# Patient Record
Sex: Female | Born: 1940 | Hispanic: No | Smoking: Never smoker
Health system: Southern US, Community
[De-identification: ages and names within clinical notes are randomized; demographics above are authoritative.]

## PROBLEM LIST (undated history)

## (undated) DIAGNOSIS — I10 Essential (primary) hypertension: Secondary | ICD-10-CM

## (undated) DIAGNOSIS — H5440 Blindness, one eye, unspecified eye: Secondary | ICD-10-CM

## (undated) DIAGNOSIS — S72001A Fracture of unspecified part of neck of right femur, initial encounter for closed fracture: Secondary | ICD-10-CM

---

## 1998-07-16 ENCOUNTER — Other Ambulatory Visit: Admission: RE | Admit: 1998-07-16 | Discharge: 1998-07-16 | Payer: Self-pay | Admitting: Family Medicine

## 1999-10-27 ENCOUNTER — Other Ambulatory Visit: Admission: RE | Admit: 1999-10-27 | Discharge: 1999-10-27 | Payer: Self-pay | Admitting: Family Medicine

## 2001-01-29 ENCOUNTER — Other Ambulatory Visit: Admission: RE | Admit: 2001-01-29 | Discharge: 2001-01-29 | Payer: Self-pay | Admitting: Family Medicine

## 2018-01-04 ENCOUNTER — Emergency Department (HOSPITAL_COMMUNITY): Payer: Medicare Other

## 2018-01-04 ENCOUNTER — Encounter (HOSPITAL_COMMUNITY): Payer: Self-pay | Admitting: Emergency Medicine

## 2018-01-04 ENCOUNTER — Inpatient Hospital Stay (HOSPITAL_COMMUNITY): Payer: Medicare Other

## 2018-01-04 ENCOUNTER — Inpatient Hospital Stay (HOSPITAL_COMMUNITY)
Admission: EM | Admit: 2018-01-04 | Discharge: 2018-01-06 | DRG: 470 | Disposition: A | Discharge status: Home / Self Care | Payer: Medicare Other | Attending: Internal Medicine | Admitting: Internal Medicine

## 2018-01-04 ENCOUNTER — Other Ambulatory Visit: Payer: Self-pay

## 2018-01-04 ENCOUNTER — Inpatient Hospital Stay (HOSPITAL_COMMUNITY): Payer: Medicare Other | Admitting: Certified Registered"

## 2018-01-04 ENCOUNTER — Encounter (HOSPITAL_COMMUNITY)
Admission: EM | Disposition: A | Discharge status: Home / Self Care | Payer: Self-pay | Source: Home / Self Care | Attending: Internal Medicine

## 2018-01-04 DIAGNOSIS — H5461 Unqualified visual loss, right eye, normal vision left eye: Secondary | ICD-10-CM | POA: Diagnosis present

## 2018-01-04 DIAGNOSIS — Z96641 Presence of right artificial hip joint: Secondary | ICD-10-CM

## 2018-01-04 DIAGNOSIS — Z8249 Family history of ischemic heart disease and other diseases of the circulatory system: Secondary | ICD-10-CM

## 2018-01-04 DIAGNOSIS — W07XXXA Fall from chair, initial encounter: Secondary | ICD-10-CM | POA: Diagnosis present

## 2018-01-04 DIAGNOSIS — S72009A Fracture of unspecified part of neck of unspecified femur, initial encounter for closed fracture: Secondary | ICD-10-CM | POA: Diagnosis present

## 2018-01-04 DIAGNOSIS — Z880 Allergy status to penicillin: Secondary | ICD-10-CM | POA: Diagnosis not present

## 2018-01-04 DIAGNOSIS — Z8781 Personal history of (healed) traumatic fracture: Secondary | ICD-10-CM

## 2018-01-04 DIAGNOSIS — D62 Acute posthemorrhagic anemia: Secondary | ICD-10-CM | POA: Diagnosis not present

## 2018-01-04 DIAGNOSIS — I10 Essential (primary) hypertension: Secondary | ICD-10-CM | POA: Diagnosis present

## 2018-01-04 DIAGNOSIS — M1990 Unspecified osteoarthritis, unspecified site: Secondary | ICD-10-CM | POA: Diagnosis present

## 2018-01-04 DIAGNOSIS — D649 Anemia, unspecified: Secondary | ICD-10-CM | POA: Diagnosis not present

## 2018-01-04 DIAGNOSIS — Z888 Allergy status to other drugs, medicaments and biological substances status: Secondary | ICD-10-CM

## 2018-01-04 DIAGNOSIS — Z01811 Encounter for preprocedural respiratory examination: Secondary | ICD-10-CM | POA: Diagnosis present

## 2018-01-04 DIAGNOSIS — Z886 Allergy status to analgesic agent status: Secondary | ICD-10-CM | POA: Diagnosis not present

## 2018-01-04 DIAGNOSIS — S72041A Displaced fracture of base of neck of right femur, initial encounter for closed fracture: Secondary | ICD-10-CM | POA: Diagnosis present

## 2018-01-04 DIAGNOSIS — M25551 Pain in right hip: Secondary | ICD-10-CM | POA: Diagnosis present

## 2018-01-04 DIAGNOSIS — Z419 Encounter for procedure for purposes other than remedying health state, unspecified: Secondary | ICD-10-CM

## 2018-01-04 HISTORY — DX: Blindness, one eye, unspecified eye: H54.40

## 2018-01-04 HISTORY — PX: TOTAL HIP ARTHROPLASTY: SHX124

## 2018-01-04 HISTORY — DX: Essential (primary) hypertension: I10

## 2018-01-04 HISTORY — DX: Fracture of unspecified part of neck of right femur, initial encounter for closed fracture: S72.001A

## 2018-01-04 LAB — CBC WITH DIFFERENTIAL/PLATELET
Basophils Absolute: 0.1 K/uL (ref 0.0–0.1)
Basophils Relative: 1 %
Eosinophils Absolute: 0.4 K/uL (ref 0.0–0.7)
Eosinophils Relative: 5 %
HCT: 42.3 % (ref 36.0–46.0)
Hemoglobin: 14.1 g/dL (ref 12.0–15.0)
Lymphocytes Relative: 17 %
Lymphs Abs: 1.5 K/uL (ref 0.7–4.0)
MCH: 28.7 pg (ref 26.0–34.0)
MCHC: 33.3 g/dL (ref 30.0–36.0)
MCV: 86.2 fL (ref 78.0–100.0)
Monocytes Absolute: 0.8 K/uL (ref 0.1–1.0)
Monocytes Relative: 9 %
Neutro Abs: 6.2 K/uL (ref 1.7–7.7)
Neutrophils Relative %: 68 %
Platelets: 264 K/uL (ref 150–400)
RBC: 4.91 MIL/uL (ref 3.87–5.11)
RDW: 13 % (ref 11.5–15.5)
WBC: 9 K/uL (ref 4.0–10.5)

## 2018-01-04 LAB — PROTIME-INR
INR: 0.98
PROTHROMBIN TIME: 12.9 s (ref 11.4–15.2)

## 2018-01-04 LAB — BASIC METABOLIC PANEL
Anion gap: 11 (ref 5–15)
BUN: 16 mg/dL (ref 6–20)
CALCIUM: 9.8 mg/dL (ref 8.9–10.3)
CO2: 22 mmol/L (ref 22–32)
CREATININE: 0.66 mg/dL (ref 0.44–1.00)
Chloride: 106 mmol/L (ref 101–111)
GFR calc Af Amer: >60 mL/min (ref >60)
GFR calc non Af Amer: >60 mL/min (ref >60)
GLUCOSE: 109 mg/dL — AB (ref 65–99)
Potassium: 4.1 mmol/L (ref 3.5–5.1)
SODIUM: 139 mmol/L (ref 135–145)

## 2018-01-04 LAB — TYPE AND SCREEN
ABO/RH(D): O POS
Antibody Screen: NEGATIVE

## 2018-01-04 LAB — ABO/RH: ABO/RH(D): O POS

## 2018-01-04 SURGERY — ARTHROPLASTY, HIP, TOTAL, ANTERIOR APPROACH
Anesthesia: Spinal | Site: Hip | Laterality: Right

## 2018-01-04 MED ORDER — LIDOCAINE HCL (CARDIAC) 20 MG/ML IV SOLN
INTRAVENOUS | Status: AC
  Filled 2018-01-04: qty 5

## 2018-01-04 MED ORDER — ONDANSETRON HCL 4 MG/2ML IJ SOLN
INTRAMUSCULAR | Status: DC | PRN
  Administered 2018-01-04: 4 mg via INTRAVENOUS

## 2018-01-04 MED ORDER — PANTOPRAZOLE SODIUM 40 MG PO TBEC
40.0000 mg | DELAYED_RELEASE_TABLET | Freq: Every day | ORAL | Status: DC
  Administered 2018-01-04 – 2018-01-06 (×3): 40 mg via ORAL
  Filled 2018-01-04 (×3): qty 1

## 2018-01-04 MED ORDER — MORPHINE SULFATE (PF) 2 MG/ML IV SOLN
0.5000 mg | INTRAVENOUS | Status: DC | PRN
  Administered 2018-01-04: 1 mg via INTRAVENOUS
  Filled 2018-01-04: qty 1

## 2018-01-04 MED ORDER — FENTANYL CITRATE (PF) 250 MCG/5ML IJ SOLN
INTRAMUSCULAR | Status: AC
  Filled 2018-01-04: qty 5

## 2018-01-04 MED ORDER — HYDROCODONE-ACETAMINOPHEN 5-325 MG PO TABS
1.0000 | ORAL_TABLET | ORAL | Status: DC | PRN
  Administered 2018-01-04: 1 via ORAL
  Administered 2018-01-05 (×5): 2 via ORAL
  Administered 2018-01-06 (×2): 1 via ORAL
  Administered 2018-01-06: 2 via ORAL
  Filled 2018-01-04 (×3): qty 2
  Filled 2018-01-04 (×2): qty 1
  Filled 2018-01-04: qty 2
  Filled 2018-01-04: qty 1
  Filled 2018-01-04 (×2): qty 2

## 2018-01-04 MED ORDER — DOCUSATE SODIUM 100 MG PO CAPS
100.0000 mg | ORAL_CAPSULE | Freq: Two times a day (BID) | ORAL | Status: DC
  Administered 2018-01-04 – 2018-01-06 (×4): 100 mg via ORAL
  Filled 2018-01-04 (×4): qty 1

## 2018-01-04 MED ORDER — CLINDAMYCIN PHOSPHATE 900 MG/50ML IV SOLN
900.0000 mg | INTRAVENOUS | Status: AC
  Administered 2018-01-04: 900 mg via INTRAVENOUS
  Filled 2018-01-04: qty 50

## 2018-01-04 MED ORDER — ONDANSETRON HCL 4 MG PO TABS
4.0000 mg | ORAL_TABLET | Freq: Four times a day (QID) | ORAL | Status: DC | PRN
  Administered 2018-01-05: 4 mg via ORAL
  Filled 2018-01-04: qty 1

## 2018-01-04 MED ORDER — METHOCARBAMOL 1000 MG/10ML IJ SOLN: 500.0000 mg | Freq: Four times a day (QID) | INTRAVENOUS | Status: DC | PRN

## 2018-01-04 MED ORDER — ALUM & MAG HYDROXIDE-SIMETH 200-200-20 MG/5ML PO SUSP: 30.0000 mL | ORAL | Status: DC | PRN

## 2018-01-04 MED ORDER — MEPERIDINE HCL 50 MG/ML IJ SOLN: 6.2500 mg | INTRAMUSCULAR | Status: DC | PRN

## 2018-01-04 MED ORDER — METHOCARBAMOL 500 MG PO TABS: 500.0000 mg | ORAL_TABLET | Freq: Four times a day (QID) | ORAL | Status: DC | PRN

## 2018-01-04 MED ORDER — PHENYLEPHRINE 40 MCG/ML (10ML) SYRINGE FOR IV PUSH (FOR BLOOD PRESSURE SUPPORT)
PREFILLED_SYRINGE | INTRAVENOUS | Status: DC | PRN
  Administered 2018-01-04: 80 ug via INTRAVENOUS

## 2018-01-04 MED ORDER — BUPIVACAINE IN DEXTROSE 0.75-8.25 % IT SOLN
INTRATHECAL | Status: DC | PRN
  Administered 2018-01-04: 12 mg via INTRATHECAL

## 2018-01-04 MED ORDER — BISACODYL 5 MG PO TBEC: 5.0000 mg | DELAYED_RELEASE_TABLET | Freq: Every day | ORAL | Status: DC | PRN

## 2018-01-04 MED ORDER — PROPOFOL 10 MG/ML IV BOLUS
INTRAVENOUS | Status: DC | PRN
  Administered 2018-01-04: 20 mg via INTRAVENOUS
  Administered 2018-01-04: 30 mg via INTRAVENOUS
  Administered 2018-01-04: 20 mg via INTRAVENOUS
  Administered 2018-01-04 (×2): 30 mg via INTRAVENOUS

## 2018-01-04 MED ORDER — PROPOFOL 500 MG/50ML IV EMUL
INTRAVENOUS | Status: DC | PRN
  Administered 2018-01-04: 50 ug/kg/min via INTRAVENOUS

## 2018-01-04 MED ORDER — CHLORHEXIDINE GLUCONATE 4 % EX LIQD: 60.0000 mL | Freq: Once | CUTANEOUS | Status: DC

## 2018-01-04 MED ORDER — FENTANYL CITRATE (PF) 100 MCG/2ML IJ SOLN
INTRAMUSCULAR | Status: AC
  Filled 2018-01-04: qty 2

## 2018-01-04 MED ORDER — METOCLOPRAMIDE HCL 5 MG PO TABS: 5.0000 mg | ORAL_TABLET | Freq: Three times a day (TID) | ORAL | Status: DC | PRN

## 2018-01-04 MED ORDER — SODIUM CHLORIDE 0.9 % IV SOLN: INTRAVENOUS | Status: DC

## 2018-01-04 MED ORDER — FENTANYL CITRATE (PF) 100 MCG/2ML IJ SOLN
INTRAMUSCULAR | Status: DC | PRN
  Administered 2018-01-04 (×3): 50 ug via INTRAVENOUS

## 2018-01-04 MED ORDER — TRANEXAMIC ACID 1000 MG/10ML IV SOLN
1000.0000 mg | INTRAVENOUS | Status: AC
  Administered 2018-01-04: 1000 mg via INTRAVENOUS
  Filled 2018-01-04: qty 10

## 2018-01-04 MED ORDER — PHENYLEPHRINE HCL 10 MG/ML IJ SOLN
INTRAMUSCULAR | Status: DC | PRN
  Administered 2018-01-04: 20 ug/min via INTRAVENOUS

## 2018-01-04 MED ORDER — METOCLOPRAMIDE HCL 5 MG/ML IJ SOLN: 5.0000 mg | Freq: Three times a day (TID) | INTRAMUSCULAR | Status: DC | PRN

## 2018-01-04 MED ORDER — CLINDAMYCIN PHOSPHATE 900 MG/50ML IV SOLN
INTRAVENOUS | Status: AC
Start: 2018-01-04 — End: 2018-01-04
  Filled 2018-01-04: qty 50

## 2018-01-04 MED ORDER — MIDAZOLAM HCL 2 MG/2ML IJ SOLN: 0.5000 mg | Freq: Once | INTRAMUSCULAR | Status: DC | PRN

## 2018-01-04 MED ORDER — ACETAMINOPHEN 325 MG PO TABS
325.0000 mg | ORAL_TABLET | Freq: Four times a day (QID) | ORAL | Status: DC | PRN
  Administered 2018-01-05 – 2018-01-06 (×2): 650 mg via ORAL
  Filled 2018-01-04 (×2): qty 2

## 2018-01-04 MED ORDER — PROMETHAZINE HCL 25 MG/ML IJ SOLN: 6.2500 mg | INTRAMUSCULAR | Status: DC | PRN

## 2018-01-04 MED ORDER — CLINDAMYCIN PHOSPHATE 600 MG/50ML IV SOLN
600.0000 mg | Freq: Four times a day (QID) | INTRAVENOUS | Status: AC
  Administered 2018-01-04 – 2018-01-05 (×2): 600 mg via INTRAVENOUS
  Filled 2018-01-04 (×2): qty 50

## 2018-01-04 MED ORDER — HYDROCODONE-ACETAMINOPHEN 5-325 MG PO TABS: 1.0000 | ORAL_TABLET | Freq: Four times a day (QID) | ORAL | Status: DC | PRN

## 2018-01-04 MED ORDER — POVIDONE-IODINE 10 % EX SWAB: 2.0000 "application " | Freq: Once | CUTANEOUS | Status: DC

## 2018-01-04 MED ORDER — LACTATED RINGERS IV SOLN
INTRAVENOUS | Status: DC | PRN
  Administered 2018-01-04 (×2): via INTRAVENOUS

## 2018-01-04 MED ORDER — MORPHINE SULFATE (PF) 4 MG/ML IV SOLN
0.5000 mg | INTRAVENOUS | Status: DC | PRN
  Filled 2018-01-04: qty 1

## 2018-01-04 MED ORDER — LIDOCAINE 2% (20 MG/ML) 5 ML SYRINGE
INTRAMUSCULAR | Status: DC | PRN
Start: 2018-01-04 — End: 2018-01-04
  Administered 2018-01-04: 40 mg via INTRAVENOUS

## 2018-01-04 MED ORDER — FENTANYL CITRATE (PF) 100 MCG/2ML IJ SOLN
25.0000 ug | INTRAMUSCULAR | Status: DC | PRN
  Administered 2018-01-04 (×2): 25 ug via INTRAVENOUS

## 2018-01-04 MED ORDER — HYDROCODONE-ACETAMINOPHEN 7.5-325 MG PO TABS: 1.0000 | ORAL_TABLET | ORAL | Status: DC | PRN

## 2018-01-04 MED ORDER — PHENOL 1.4 % MT LIQD: 1.0000 | OROMUCOSAL | Status: DC | PRN

## 2018-01-04 MED ORDER — 0.9 % SODIUM CHLORIDE (POUR BTL) OPTIME
TOPICAL | Status: DC | PRN
  Administered 2018-01-04: 1000 mL

## 2018-01-04 MED ORDER — LACTATED RINGERS IV SOLN: INTRAVENOUS | Status: DC

## 2018-01-04 MED ORDER — SODIUM CHLORIDE 0.9 % IR SOLN
Status: DC | PRN
  Administered 2018-01-04: 3000 mL

## 2018-01-04 MED ORDER — PROPOFOL 10 MG/ML IV BOLUS
INTRAVENOUS | Status: AC
  Filled 2018-01-04: qty 20

## 2018-01-04 MED ORDER — METHOCARBAMOL 1000 MG/10ML IJ SOLN
500.0000 mg | Freq: Four times a day (QID) | INTRAVENOUS | Status: DC | PRN
  Filled 2018-01-04: qty 5

## 2018-01-04 MED ORDER — MENTHOL 3 MG MT LOZG: 1.0000 | LOZENGE | OROMUCOSAL | Status: DC | PRN

## 2018-01-04 MED ORDER — POLYETHYLENE GLYCOL 3350 17 G PO PACK: 17.0000 g | PACK | Freq: Every day | ORAL | Status: DC | PRN

## 2018-01-04 MED ORDER — ZOLPIDEM TARTRATE 5 MG PO TABS: 5.0000 mg | ORAL_TABLET | Freq: Every evening | ORAL | Status: DC | PRN

## 2018-01-04 MED ORDER — ONDANSETRON HCL 4 MG/2ML IJ SOLN: 4.0000 mg | Freq: Four times a day (QID) | INTRAMUSCULAR | Status: DC | PRN

## 2018-01-04 MED ORDER — METHOCARBAMOL 500 MG PO TABS
500.0000 mg | ORAL_TABLET | Freq: Four times a day (QID) | ORAL | Status: DC | PRN
  Administered 2018-01-04 – 2018-01-05 (×3): 500 mg via ORAL
  Filled 2018-01-04 (×3): qty 1

## 2018-01-04 MED ORDER — SODIUM CHLORIDE 0.9 % IV SOLN
INTRAVENOUS | Status: DC
  Administered 2018-01-04: 23:00:00 via INTRAVENOUS

## 2018-01-04 SURGICAL SUPPLY — 56 items
APL SKNCLS STERI-STRIP NONHPOA (GAUZE/BANDAGES/DRESSINGS) ×1
BENZOIN TINCTURE PRP APPL 2/3 (GAUZE/BANDAGES/DRESSINGS) ×3 IMPLANT
BLADE CLIPPER SURG (BLADE) IMPLANT
BLADE SAW SGTL 18X1.27X75 (BLADE) ×2 IMPLANT
BLADE SAW SGTL 18X1.27X75MM (BLADE) ×1
CAPT HIP TOTAL 2 ×2 IMPLANT
CELLS DAT CNTRL 66122 CELL SVR (MISCELLANEOUS) IMPLANT
CLOSURE WOUND 1/2 X4 (GAUZE/BANDAGES/DRESSINGS) ×2
COVER SURGICAL LIGHT HANDLE (MISCELLANEOUS) ×3 IMPLANT
DRAPE C-ARM 42X72 X-RAY (DRAPES) ×3 IMPLANT
DRAPE STERI IOBAN 125X83 (DRAPES) ×3 IMPLANT
DRAPE U-SHAPE 47X51 STRL (DRAPES) ×9 IMPLANT
DRESSING ALLEVYN LIFE SACRUM (GAUZE/BANDAGES/DRESSINGS) ×2 IMPLANT
DRSG AQUACEL AG ADV 3.5X10 (GAUZE/BANDAGES/DRESSINGS) ×3 IMPLANT
DURAPREP 26ML APPLICATOR (WOUND CARE) ×3 IMPLANT
ELECT BLADE 4.0 EZ CLEAN MEGAD (MISCELLANEOUS) ×3
ELECT BLADE 6.5 EXT (BLADE) IMPLANT
ELECT REM PT RETURN 9FT ADLT (ELECTROSURGICAL) ×3
ELECTRODE BLDE 4.0 EZ CLN MEGD (MISCELLANEOUS) ×1 IMPLANT
ELECTRODE REM PT RTRN 9FT ADLT (ELECTROSURGICAL) ×1 IMPLANT
FACESHIELD WRAPAROUND (MASK) ×6 IMPLANT
FACESHIELD WRAPAROUND OR TEAM (MASK) ×2 IMPLANT
GAUZE XEROFORM 1X8 LF (GAUZE/BANDAGES/DRESSINGS) ×2 IMPLANT
GLOVE BIOGEL PI IND STRL 8 (GLOVE) ×2 IMPLANT
GLOVE BIOGEL PI INDICATOR 8 (GLOVE) ×4
GLOVE ECLIPSE 8.0 STRL XLNG CF (GLOVE) ×3 IMPLANT
GLOVE ORTHO TXT STRL SZ7.5 (GLOVE) ×6 IMPLANT
GOWN STRL REUS W/ TWL LRG LVL3 (GOWN DISPOSABLE) ×2 IMPLANT
GOWN STRL REUS W/ TWL XL LVL3 (GOWN DISPOSABLE) ×2 IMPLANT
GOWN STRL REUS W/TWL LRG LVL3 (GOWN DISPOSABLE) ×6
GOWN STRL REUS W/TWL XL LVL3 (GOWN DISPOSABLE) ×6
HANDPIECE INTERPULSE COAX TIP (DISPOSABLE) ×3
KIT BASIN OR (CUSTOM PROCEDURE TRAY) ×3 IMPLANT
KIT ROOM TURNOVER OR (KITS) ×3 IMPLANT
MANIFOLD NEPTUNE II (INSTRUMENTS) ×3 IMPLANT
NS IRRIG 1000ML POUR BTL (IV SOLUTION) ×3 IMPLANT
PACK TOTAL JOINT (CUSTOM PROCEDURE TRAY) ×3 IMPLANT
PAD ARMBOARD 7.5X6 YLW CONV (MISCELLANEOUS) ×3 IMPLANT
RETRACTOR WND ALEXIS 18 MED (MISCELLANEOUS) ×1 IMPLANT
RTRCTR WOUND ALEXIS 18CM MED (MISCELLANEOUS)
SET HNDPC FAN SPRY TIP SCT (DISPOSABLE) ×1 IMPLANT
STAPLER VISISTAT 35W (STAPLE) ×2 IMPLANT
STRIP CLOSURE SKIN 1/2X4 (GAUZE/BANDAGES/DRESSINGS) ×4 IMPLANT
SUT ETHIBOND NAB CT1 #1 30IN (SUTURE) ×3 IMPLANT
SUT MNCRL AB 4-0 PS2 18 (SUTURE) IMPLANT
SUT VIC AB 0 CT1 27 (SUTURE) ×3
SUT VIC AB 0 CT1 27XBRD ANBCTR (SUTURE) ×1 IMPLANT
SUT VIC AB 1 CT1 27 (SUTURE) ×3
SUT VIC AB 1 CT1 27XBRD ANBCTR (SUTURE) ×1 IMPLANT
SUT VIC AB 2-0 CT1 27 (SUTURE) ×3
SUT VIC AB 2-0 CT1 TAPERPNT 27 (SUTURE) ×1 IMPLANT
TOWEL OR 17X24 6PK STRL BLUE (TOWEL DISPOSABLE) ×3 IMPLANT
TOWEL OR 17X26 10 PK STRL BLUE (TOWEL DISPOSABLE) ×3 IMPLANT
TRAY CATH 16FR W/PLASTIC CATH (SET/KITS/TRAYS/PACK) ×2 IMPLANT
TRAY FOLEY W/METER SILVER 16FR (SET/KITS/TRAYS/PACK) IMPLANT
WATER STERILE IRR 1000ML POUR (IV SOLUTION) ×6 IMPLANT

## 2018-01-04 NOTE — Brief Op Note (Signed)
01/04/2018  6:06 PM  PATIENT:  Kiara Reynolds  77 y.o. female  PRE-OPERATIVE DIAGNOSIS:  Femoral neck Fracture  POST-OPERATIVE DIAGNOSIS:  Femoral neck Fracture  PROCEDURE:  Procedure(s): TOTAL HIP ARTHROPLASTY ANTERIOR APPROACH (Right)  SURGEON:  Surgeon(s) and Role:    Kathryne Hitch* Maricsa Sammons Y, MD - Primary  PHYSICIAN ASSISTANT: Rexene EdisonGil Clark, PA-C  ANESTHESIA:   spinal  COUNTS:  YES  PLAN OF CARE: Admit to inpatient   PATIENT DISPOSITION:  PACU - hemodynamically stable.   Delay start of Pharmacological VTE agent (>24hrs) due to surgical blood loss or risk of bleeding: no

## 2018-01-04 NOTE — ED Triage Notes (Signed)
Pt to ER sent from PCP due to confirmed right hip fracture after a fall nearly 2 weeks ago. Has been ambulatory. Pt in NAD.

## 2018-01-04 NOTE — Anesthesia Preprocedure Evaluation (Addendum)
Anesthesia Evaluation  Patient identified by MRN, date of birth, ID band Patient awake    Reviewed: Allergy & Precautions, NPO status , Patient's Chart, lab work & pertinent test results  History of Anesthesia Complications Negative for: history of anesthetic complications  Airway Mallampati: II  TM Distance: >3 FB Neck ROM: Full    Dental  (+) Dental Advisory Given   Pulmonary neg pulmonary ROS,    breath sounds clear to auscultation       Cardiovascular hypertension (on no meds), (-) angina Rhythm:Regular Rate:Normal     Neuro/Psych negative neurological ROS     GI/Hepatic negative GI ROS, Neg liver ROS,   Endo/Other  negative endocrine ROS  Renal/GU negative Renal ROS     Musculoskeletal  (+) Arthritis , Osteoarthritis,    Abdominal   Peds  Hematology negative hematology ROS (+)   Anesthesia Other Findings   Reproductive/Obstetrics                            Anesthesia Physical Anesthesia Plan  ASA: II  Anesthesia Plan: Spinal   Post-op Pain Management:    Induction:   PONV Risk Score and Plan: 2 and Ondansetron and Dexamethasone  Airway Management Planned: Natural Airway and Nasal Cannula  Additional Equipment:   Intra-op Plan:   Post-operative Plan:   Informed Consent: I have reviewed the patients History and Physical, chart, labs and discussed the procedure including the risks, benefits and alternatives for the proposed anesthesia with the patient or authorized representative who has indicated his/her understanding and acceptance.   Dental advisory given  Plan Discussed with: CRNA and Surgeon  Anesthesia Plan Comments: (Plan routine monitors, SAB)        Anesthesia Quick Evaluation

## 2018-01-04 NOTE — Anesthesia Procedure Notes (Signed)
Spinal  Patient location during procedure: OR End time: 01/04/2018 4:38 PM Staffing Anesthesiologist: Jairo BenJackson, Francies Inch, MD Performed: anesthesiologist  Preanesthetic Checklist Completed: patient identified, site marked, surgical consent, pre-op evaluation, timeout performed, IV checked, risks and benefits discussed and monitors and equipment checked Spinal Block Patient position: sitting Prep: site prepped and draped and DuraPrep Patient monitoring: heart rate, cardiac monitor, continuous pulse ox and blood pressure Approach: midline Location: L3-4 Injection technique: single-shot Needle Needle type: Quincke  Needle gauge: 25 G Needle length: 9 cm Additional Notes Pt identified in Operating room.  Monitors applied. Working IV access confirmed. Sterile prep, drape lumbar spine.  1% lido local L 3,4.  #25ga Quincke into clear CSF L 3,4.  12mg  0.75% Bupivacaine with dextrose injected with asp CSF beginning and end of injection.  Patient asymptomatic, VSS, no heme aspirated, tolerated well.  Sandford Craze Theresea Trautmann, MD

## 2018-01-04 NOTE — ED Notes (Signed)
Pt has images with her.

## 2018-01-04 NOTE — ED Provider Notes (Signed)
Patient placed in Quick Look pathway, seen and evaluated   Chief Complaint: hip fracture   HPI:   Patient fell off a stool 2 weeks ago, right hip pain since.  Went to primary care doctor who ordered outpatient images of her hip and was told to come here for a positive fracture.  Patient has x-rays with her, I reviewed the x-rays, does appear to have intertrochanteric fracture.  She has been ambulatory on it.  Denies any other injuries.  No numbness or weakness distally.  ROS: Positive for hip pain.  Negative for numbness or weakness.  Negative for back pain.  Physical Exam:   Gen: No distress  Neuro: Awake and Alert  Skin: Warm    Focused Exam: Tenderness to palpation of her right hip.  Pain with range of motion of the hip.  Dorsal pedal pulses intact.   Initiation of care has begun. The patient has been counseled on the process, plan, and necessity for staying for the completion/evaluation, and the remainder of the medical screening examination   Patient medically screened by me by request of RN.  She is hypertensive, otherwise normal vital signs.  Positive hip fracture on x-ray.  I was able to pull up images, however I will reimage her so we have x-rays in our system.  We will get hip fracture protocol lab orders.  This was all discussed with patient she agrees. Patient prefers Dr. Magnus IvanBlackman as an orthopedist, informed her he is on call today.    Jaynie CrumbleKirichenko, Catia Todorov, PA-C 01/04/18 1244    Charlynne PanderYao, David Hsienta, MD 01/07/18 1220

## 2018-01-04 NOTE — Transfer of Care (Signed)
Immediate Anesthesia Transfer of Care Note  Patient: Alberteen Samlizabeth Stotler  Procedure(s) Performed: TOTAL HIP ARTHROPLASTY ANTERIOR APPROACH (Right Hip)  Patient Location: PACU  Anesthesia Type:Spinal  Level of Consciousness: awake and patient cooperative  Airway & Oxygen Therapy: Patient Spontanous Breathing and Patient connected to face mask oxygen  Post-op Assessment: Report given to RN and Post -op Vital signs reviewed and stable  Post vital signs: Reviewed and stable  Last Vitals:  Vitals:   01/04/18 1228  BP: (!) 171/73  Pulse: 81  Resp: 18  Temp: 36.7 C  SpO2: 100%    Last Pain:  Vitals:   01/04/18 1228  TempSrc: Oral         Complications: No apparent anesthesia complications

## 2018-01-04 NOTE — OR Nursing (Signed)
1805: in&out cath=250cc cyu, per protocol, no trauma.

## 2018-01-04 NOTE — OR Nursing (Signed)
1810:  allevyn sacral pad placed to initiate protocol; skin clear and supple at this time.

## 2018-01-04 NOTE — ED Provider Notes (Signed)
[facility] EMERGENCY DEPARTMENT Provider Note   CSN: [id number] Arrival date & time: 01/04/18  1221     History   Chief Complaint Chief Complaint  Patient presents with  . Hip Pain    HPI Kiara Reynolds is a 77 y.o. female.  HPI Kiara Reynolds is a 78 y.o. female presents to ED with complaint of right hip pain. Pt states she fell off a stool 2 weeks ago. Reports soreness, but able to ambulate.  She went to her primary care doctor who ordered x-rays and she was told to come to the ED because x-rays were positive for hip fracture.  No history of the same.  No medical problems.  Does not take any medications.  She states walking and moving hip makes it worse, nothing makes it better.  Past Medical History:  Diagnosis Date  . Blind right eye     There are no active problems to display for this patient.   History reviewed. No pertinent surgical history.  OB History    No data available       Home Medications    Prior to Admission medications   Not on File    Family History History reviewed. No pertinent family history.  Social History Social History   Tobacco Use  . Smoking status: Never Smoker  . Smokeless tobacco: Never Used  Substance Use Topics  . Alcohol use: No    Frequency: Never  . Drug use: No     Allergies   Penicillins and Aspirin   Review of Systems Review of Systems  Constitutional: Negative for chills and fever.  Respiratory: Negative for cough, chest tightness and shortness of breath.   Cardiovascular: Negative for chest pain, palpitations and leg swelling.  Gastrointestinal: Negative for abdominal pain, diarrhea, nausea and vomiting.  Musculoskeletal: Positive for arthralgias. Negative for neck pain and neck stiffness.  Skin: Negative for rash.  Neurological: Negative for dizziness, weakness and headaches.  All other systems reviewed and are negative.    Physical Exam Updated Vital Signs BP (!) 171/73 (BP  Location: Right Arm)   Pulse 81   Temp 98 F (36.7 C) (Oral)   Resp 18   SpO2 100%   Physical Exam  Constitutional: She appears well-developed and well-nourished. No distress.  HENT:  Head: Normocephalic.  Eyes: Conjunctivae are normal.  Neck: Neck supple.  Cardiovascular: Normal rate, regular rhythm and normal heart sounds.  Pulmonary/Chest: Effort normal and breath sounds normal. No respiratory distress. She has no wheezes. She has no rales.  Abdominal: Soft. Bowel sounds are normal. She exhibits no distension. There is no tenderness. There is no rebound.  Musculoskeletal: She exhibits no edema.  TTP over right hip. Limited ROM due to pain. DP pulses intact.   Neurological: She is alert.  Skin: Skin is warm and dry.  Psychiatric: She has a normal mood and affect. Her behavior is normal.  Nursing note and vitals reviewed.    ED Treatments / Results  Labs (all labs ordered are listed, but only abnormal results are displayed) Labs Reviewed  BASIC METABOLIC PANEL - Abnormal; Notable for the following components:      Result Value   Glucose, Bld 109 (*)    All other components within normal limits  CBC WITH DIFFERENTIAL/PLATELET  PROTIME-INR  TYPE AND SCREEN    EKG  EKG Interpretation None       Radiology Dg Hip Unilat With Pelvis 2-3 Views Right  Result Date: 01/04/2018 CLINICAL DATA:  Acute RIGHT hip pain following fall 10 days ago. Initial encounter. EXAM: DG HIP (WITH OR WITHOUT PELVIS) 2-3V RIGHT COMPARISON:  None FINDINGS: A RIGHT femoral neck fracture is noted valgus angulation. There is no evidence of dislocation. The visualized portions of the LEFT hip are unremarkable. Degenerative changes in the LOWER lumbar spine are identified. IMPRESSION: RIGHT femoral neck fracture. Electronically Signed   By: Harmon PierJeffrey  Hu M.D.   On: 01/04/2018 13:20    Procedures Procedures (including critical care time)  Medications Ordered in ED Medications - No data to  display   Initial Impression / Assessment and Plan / ED Course  I have reviewed the triage vital signs and the nursing notes.  Pertinent labs & imaging results that were available during my care of the patient were reviewed by me and considered in my medical decision making (see chart for details).     Pt with right hip fracture, two weeks ago. Hip fracture labs ordered. Pt does not want any medications at this time. She is neurovascularly intact  1:50 PM Spoke with Orthopedics, will come by and see pt. ]  2:01 PM Spoke with medicine, will admit  Vitals:   01/04/18 1228  BP: (!) 171/73  Pulse: 81  Resp: 18  Temp: 98 F (36.7 C)  TempSrc: Oral  SpO2: 100%     Final Clinical Impressions(s) / ED Diagnoses   Final diagnoses:  S/P right hip fracture    ED Discharge Orders    None       Jaynie CrumbleKirichenko, Benjiman Sedgwick, PA-C 01/04/18 1402    Charlynne PanderYao, David Hsienta, MD 01/07/18 [id number]

## 2018-01-04 NOTE — H&P (Signed)
History and Physical    Kiara Reynolds ZOX:[id number] DOB: 24-Nov-1940 DOA: 01/04/2018  PCP: No primary care provider on file. 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 Initial evaluation reveals right femur fracture. [facility] are asked to admit  Information is obtained from the patient and her husband who is at the bedside. She states 2 days ago she was sitting on a stool and she "slid off and landed on my right hip". She reports she was unable to get up on her 27 year old grandson had to assist her to the couch. She then needed assistance to the car to go home. For the next 2 days he ambulated with a cane and/or walker she reports the pain improved. She did indicate it was difficult to sleep to find a comfortable position. Today after much encouragement from her family and lack of further improvement in her pain she went to her primary care provider who ordered x-rays and she was told to come to the ED because x-rays were positive for hip fracture. She denies any recent illness fever chills headache dizziness syncope or near-syncope. She denies any nausea vomiting diarrhea constipation melena bright red blood per rectum. She denies dysuria hematuria frequency or urgency. She denies chest pain palpitation shortness of breath lower extremity edema. She describes the pain in her right hip as an ache characterizes it as constant worse with movement and weightbearing. She reports that nothing makes it better.   ED Course: In the emergency department she's afebrile hemodynamically stable with a blood pressure high end of normal. She is not hypoxic  Review of Systems: As per HPI otherwise all other systems reviewed and are negative.   Ambulatory Status: Ambulates  independently is independent with ADLs.  Past Medical History:  Diagnosis Date  . Blind right eye   . Hip fx, right, closed, initial encounter (HCC)   . Hypertension    not on antihypertensive med    History reviewed. No pertinent surgical history.  Social History   Socioeconomic History  . Marital status: Unknown    Spouse name: Not on file  . Number of children: Not on file  . Years of education: Not on file  . Highest education level: Not on file  Social Needs  . Financial resource strain: Not on file  . Food insecurity - worry: Not on file  . Food insecurity - inability: Not on file  . Transportation needs - medical: Not on file  . Transportation needs - non-medical: Not on file  Occupational History  . Not on file  Tobacco Use  . Smoking status: Never Smoker  . Smokeless tobacco: Never Used  Substance and Sexual Activity  . Alcohol use: No    Frequency: Never  . Drug use: No  . Sexual activity: Not on file  Other Topics Concern  . Not on file  Social History Narrative  . Not on file    Allergies  Allergen Reactions  . Penicillins   . Aspirin     Family History  Problem Relation Age of Onset  . Hypertension Mother     Prior to Admission medications   Not on File    Physical Exam: Vitals:   01/04/18 1228  BP: (!) 171/73  Pulse: 81  Resp: 18  Temp:  98 F (36.7 C)  TempSrc: Oral  SpO2: 100%     General:  Appears calm and comfortable sitting in a wheelchair with follow her close on in no acute distress Eyes:  PERRL, EOMI, normal lids, iris ENT:  grossly normal hearing, lips & tongue, mucous membranes of her mouth are pink and dry Neck:  no LAD, masses or thyromegaly Cardiovascular:  RRR, no m/r/g. No LE edema.  Respiratory:  CTA bilaterally, no w/r/r. Normal respiratory effort. Abdomen:  soft, nondistended nontender to palpation positive bowel sounds throughout no guarding or rebounding Skin:  no rash or induration seen on limited  exam Musculoskeletal:  grossly normal tone BUE/BLE, good ROM, no bony abnormality. Mild tenderness to palpation over her right hip I pedal pulse intact. Decreased range of motion of her right leg due to hip pain Psychiatric:  grossly normal mood and affect, speech fluent and appropriate, AOx3 Neurologic:  CN 2-12 grossly intact, moves all extremities in coordinated fashion, sensation intact speech clear facial symmetry  Labs on Admission: I have personally reviewed following labs and imaging studies  CBC: Recent Labs  Lab 01/04/18 1255  WBC 9.0  NEUTROABS 6.2  HGB 14.1  HCT 42.3  MCV 86.2  PLT 264   Basic Metabolic Panel: Recent Labs  Lab 01/04/18 1255  NA 139  K 4.1  CL 106  CO2 22  GLUCOSE 109*  BUN 16  CREATININE 0.66  CALCIUM 9.8   GFR: CrCl cannot be calculated (Unknown ideal weight.). Liver Function Tests: No results for input(s): AST, ALT, ALKPHOS, BILITOT, PROT, ALBUMIN in the last 168 hours. No results for input(s): LIPASE, AMYLASE in the last 168 hours. No results for input(s): AMMONIA in the last 168 hours. Coagulation Profile: Recent Labs  Lab 01/04/18 1255  INR 0.98   Cardiac Enzymes: No results for input(s): CKTOTAL, CKMB, CKMBINDEX, TROPONINI in the last 168 hours. BNP (last 3 results) No results for input(s): PROBNP in the last 8760 hours. HbA1C: No results for input(s): HGBA1C in the last 72 hours. CBG: No results for input(s): GLUCAP in the last 168 hours. Lipid Profile: No results for input(s): CHOL, HDL, LDLCALC, TRIG, CHOLHDL, LDLDIRECT in the last 72 hours. Thyroid Function Tests: No results for input(s): TSH, T4TOTAL, FREET4, T3FREE, THYROIDAB in the last 72 hours. Anemia Panel: No results for input(s): VITAMINB12, FOLATE, FERRITIN, TIBC, IRON, RETICCTPCT in the last 72 hours. Urine analysis: No results found for: COLORURINE, APPEARANCEUR, LABSPEC, PHURINE, GLUCOSEU, HGBUR, BILIRUBINUR, KETONESUR, PROTEINUR, UROBILINOGEN, NITRITE,  LEUKOCYTESUR  Creatinine Clearance: CrCl cannot be calculated (Unknown ideal weight.).  Sepsis Labs: @LABRCNTIP (procalcitonin:4,lacticidven:4) )No results found for this or any previous visit (from the past 240 hour(s)).   Radiological Exams on Admission: Dg Hip Unilat With Pelvis 2-3 Views Right  Result Date: 01/04/2018 CLINICAL DATA:  Acute RIGHT hip pain following fall 10 days ago. Initial encounter. EXAM: DG HIP (WITH OR WITHOUT PELVIS) 2-3V RIGHT COMPARISON:  None FINDINGS: A RIGHT femoral neck fracture is noted valgus angulation. There is no evidence of dislocation. The visualized portions of the LEFT hip are unremarkable. Degenerative changes in the LOWER lumbar spine are identified. IMPRESSION: RIGHT femoral neck fracture. Electronically Signed   By: Harmon PierJeffrey  Hu M.D.   On: 01/04/2018 13:20    EKG: pending on admission  Assessment/Plan Active Problems:   Hip fx (HCC)   Hypertension   Right hip pain   #1. Right hip fracture. Related to a mechanical fall. X-ray reveals right femoral neck fracture. Mechanical fall happened 2  days ago. She's been ambulating on it ever since. She's been evaluated by orthopedic surgery who planned for surgery this afternoon -Admit to MedSurg -Obtain a chest x-ray -Urinalysis -Pain control -gentle iv fluids -robaxin -PT -social work -Nothing by mouth  #2. Hypertension. Patient states she was treated for about a year and then medication was discontinued his hypertension resolved. Blood pressure high-end of normal in the emergency department. Likely related to discomfort/anxiety -When necessary hydralazine     DVT prophylaxis: scd Code Status: full  Family Communication: husband at bedside  Disposition Plan: home  Consults called: dr Magnus Ivan ortho  Admission status: inpatient    Gwenyth Bender MD [facility]  If 7PM-7AM, please contact night-coverage [email or web address] Password [email or web address]  01/04/2018, 2:30 PM

## 2018-01-04 NOTE — Consult Note (Signed)
Reason for Consult:Right fem neck fx Referring Physician: D Ashyia Schraeder is an 77 y.o. female.  HPI: Kiara Reynolds lost her balance on a stool about 10d ago. She landed on her right side and had immediate pain. She was able to get up and ambulate and has been using a cane since then. She states the pain has slowly been improving but family insisted she come in and get checked out. Her PCP did an x-ray and noted a femoral neck fx and sent her to the ED and orthopedic surgery was consulted. She is an independent Hydrographic surveyor.  Past Medical History:  Diagnosis Date  . Blind right eye     History reviewed. No pertinent surgical history.  History reviewed. No pertinent family history.  Social History:  reports that  has never smoked. she has never used smokeless tobacco. She reports that she does not drink alcohol or use drugs.  Allergies:  Allergies  Allergen Reactions  . Penicillins   . Aspirin     Medications: I have reviewed the patient's current medications.  Results for orders placed or performed during the hospital encounter of 01/04/18 (from the past 48 hour(s))  Basic metabolic panel     Status: Abnormal   Collection Time: 01/04/18 12:55 PM  Result Value Ref Range   Sodium 139 135 - 145 mmol/L   Potassium 4.1 3.5 - 5.1 mmol/L   Chloride 106 101 - 111 mmol/L   CO2 22 22 - 32 mmol/L   Glucose, Bld 109 (H) 65 - 99 mg/dL   BUN 16 6 - 20 mg/dL   Creatinine, Ser 0.66 0.44 - 1.00 mg/dL   Calcium 9.8 8.9 - 10.3 mg/dL   GFR calc non Af Amer >60 >60 mL/min   GFR calc Af Amer >60 >60 mL/min    Comment: (NOTE) The eGFR has been calculated using the CKD EPI equation. This calculation has not been validated in all clinical situations. eGFR's persistently <60 mL/min signify possible Chronic Kidney Disease.    Anion gap 11 5 - 15    Comment: Performed at [facility] [address] [place], [place] [address]  CBC WITH DIFFERENTIAL     Status: None   Collection  Time: 01/04/18 12:55 PM  Result Value Ref Range   WBC 9.0 4.0 - 10.5 K/uL   RBC 4.91 3.87 - 5.11 MIL/uL   Hemoglobin 14.1 12.0 - 15.0 g/dL   HCT 42.3 36.0 - 46.0 %   MCV 86.2 78.0 - 100.0 fL   MCH 28.7 26.0 - 34.0 pg   MCHC 33.3 30.0 - 36.0 g/dL   RDW 13.0 11.5 - 15.5 %   Platelets 264 150 - 400 K/uL   Neutrophils Relative % 68 %   Neutro Abs 6.2 1.7 - 7.7 K/uL   Lymphocytes Relative 17 %   Lymphs Abs 1.5 0.7 - 4.0 K/uL   Monocytes Relative 9 %   Monocytes Absolute 0.8 0.1 - 1.0 K/uL   Eosinophils Relative 5 %   Eosinophils Absolute 0.4 0.0 - 0.7 K/uL   Basophils Relative 1 %   Basophils Absolute 0.1 0.0 - 0.1 K/uL    Comment: Performed at [facility], [place] [address], [place], [place] [address]  Protime-INR     Status: None   Collection Time: 01/04/18 12:55 PM  Result Value Ref Range   Prothrombin Time 12.9 11.4 - 15.2 seconds   INR 0.98     Comment: Performed at [facility], [place] [address] [place], [place] [address]    Dg Hip Unilat With Pelvis 2-3 Views Right  Result Date: 01/04/2018 CLINICAL DATA:  Acute RIGHT hip pain following fall 10 days ago. Initial encounter. EXAM: DG HIP (WITH OR WITHOUT PELVIS) 2-3V RIGHT COMPARISON:  None FINDINGS: A RIGHT femoral neck fracture is noted valgus angulation. There is no evidence of dislocation. The visualized portions of the LEFT hip are unremarkable. Degenerative changes in the LOWER lumbar spine are identified. IMPRESSION: RIGHT femoral neck fracture. Electronically Signed   By: Margarette Canada M.D.   On: 01/04/2018 13:20    Review of Systems  Constitutional: Negative for weight loss.  HENT: Negative for ear discharge, ear pain, hearing loss and tinnitus.   Eyes: Negative for blurred vision, double vision, photophobia and pain.  Respiratory: Negative for cough, sputum production and shortness of breath.   Cardiovascular: Negative for chest pain.  Gastrointestinal: Negative for abdominal pain, nausea and vomiting.   Genitourinary: Negative for dysuria, flank pain, frequency and urgency.  Musculoskeletal: Positive for joint pain (Right hip). Negative for back pain, falls, myalgias and neck pain.  Neurological: Negative for dizziness, tingling, sensory change, focal weakness, loss of consciousness and headaches.  Endo/Heme/Allergies: Does not bruise/bleed easily.  Psychiatric/Behavioral: Negative for depression, memory loss and substance abuse. The patient is not nervous/anxious.    Blood pressure (!) 171/73, pulse 81, temperature 98 F (36.7 C), temperature source Oral, resp. rate 18, SpO2 100 %. Physical Exam  Constitutional: She appears well-developed and well-nourished. No distress.  HENT:  Head: Normocephalic and atraumatic.  Eyes: Conjunctivae are normal. Right eye exhibits no discharge. Left eye exhibits no discharge. No scleral icterus.  Neck: Normal range of motion.  Cardiovascular: Normal rate and regular rhythm.  Respiratory: Effort normal. No respiratory distress.  Musculoskeletal:  RLE No traumatic wounds, ecchymosis, or rash  Mild hip TTP  No knee or ankle effusion  Knee stable to varus/ valgus and anterior/posterior stress, mild medial TTP  Sens DPN, SPN, TN intact  Motor EHL, ext, flex, evers 5/5  DP 2+, PT 2+, No significant edema  Neurological: She is alert.  Skin: Skin is warm and dry. She is not diaphoretic.  Psychiatric: She has a normal mood and affect. Her behavior is normal.    Assessment/Plan: Fall Right femoral neck fx -- For THA this afternoon by Dr. Ninfa Linden. NPO until then.    Lisette Abu, PA-C Orthopedic Surgery [phone] 01/04/2018, 1:43 PM

## 2018-01-04 NOTE — Anesthesia Postprocedure Evaluation (Signed)
Anesthesia Post Note  Patient: Kiara Reynolds  Procedure(s) Performed: TOTAL HIP ARTHROPLASTY ANTERIOR APPROACH (Right Hip)     Patient location during evaluation: PACU Anesthesia Type: Spinal Level of consciousness: awake and alert, patient cooperative and oriented Pain management: pain level controlled Vital Signs Assessment: post-procedure vital signs reviewed and stable Respiratory status: spontaneous breathing, nonlabored ventilation, respiratory function stable and patient connected to nasal cannula oxygen Cardiovascular status: blood pressure returned to baseline and stable Postop Assessment: spinal receding, patient able to bend at knees and no apparent nausea or vomiting Anesthetic complications: no    Last Vitals:  Vitals:   01/04/18 1900 01/04/18 1901  BP:  (!) 122/58  Pulse: 69 70  Resp: 14 15  Temp:    SpO2: 99% 99%    Last Pain:  Vitals:   01/04/18 1228  TempSrc: Oral                 Carmela Piechowski,E. Santiago Graf

## 2018-01-05 DIAGNOSIS — D649 Anemia, unspecified: Secondary | ICD-10-CM

## 2018-01-05 LAB — CBC
HEMATOCRIT: 32.3 % — AB (ref 36.0–46.0)
HEMOGLOBIN: 10.6 g/dL — AB (ref 12.0–15.0)
MCH: 28.6 pg (ref 26.0–34.0)
MCHC: 32.8 g/dL (ref 30.0–36.0)
MCV: 87.1 fL (ref 78.0–100.0)
Platelets: 197 10*3/uL (ref 150–400)
RBC: 3.71 MIL/uL — AB (ref 3.87–5.11)
RDW: 13.1 % (ref 11.5–15.5)
WBC: 8.7 10*3/uL (ref 4.0–10.5)

## 2018-01-05 LAB — BASIC METABOLIC PANEL
Anion gap: 9 (ref 5–15)
BUN: 16 mg/dL (ref 6–20)
CHLORIDE: 104 mmol/L (ref 101–111)
CO2: 23 mmol/L (ref 22–32)
Calcium: 8.4 mg/dL — ABNORMAL LOW (ref 8.9–10.3)
Creatinine, Ser: 0.73 mg/dL (ref 0.44–1.00)
GFR calc non Af Amer: >60 mL/min (ref >60)
Glucose, Bld: 127 mg/dL — ABNORMAL HIGH (ref 65–99)
Potassium: 4.1 mmol/L (ref 3.5–5.1)
SODIUM: 136 mmol/L (ref 135–145)

## 2018-01-05 LAB — URINALYSIS, ROUTINE W REFLEX MICROSCOPIC
BILIRUBIN URINE: NEGATIVE
Bacteria, UA: NONE SEEN
GLUCOSE, UA: NEGATIVE mg/dL
KETONES UR: NEGATIVE mg/dL
LEUKOCYTES UA: NEGATIVE
NITRITE: NEGATIVE
PH: 5 (ref 5.0–8.0)
PROTEIN: NEGATIVE mg/dL
Specific Gravity, Urine: 1.006 (ref 1.005–1.030)
Squamous Epithelial / LPF: NONE SEEN

## 2018-01-05 LAB — HEMOGLOBIN AND HEMATOCRIT, BLOOD
HEMATOCRIT: 32.6 % — AB (ref 36.0–46.0)
Hemoglobin: 10.9 g/dL — ABNORMAL LOW (ref 12.0–15.0)

## 2018-01-05 MED ORDER — ENSURE ENLIVE PO LIQD
237.0000 mL | Freq: Two times a day (BID) | ORAL | Status: DC
  Administered 2018-01-05 – 2018-01-06 (×2): 237 mL via ORAL

## 2018-01-05 MED ORDER — ENOXAPARIN SODIUM 40 MG/0.4ML ~~LOC~~ SOLN: 40.0000 mg | SUBCUTANEOUS | Status: DC

## 2018-01-05 MED ORDER — ENOXAPARIN SODIUM 40 MG/0.4ML ~~LOC~~ SOLN
40.0000 mg | SUBCUTANEOUS | Status: DC
  Administered 2018-01-05: 40 mg via SUBCUTANEOUS
  Filled 2018-01-05: qty 0.4

## 2018-01-05 NOTE — Evaluation (Signed)
Occupational Therapy Evaluation Patient Details Name: Kiara Reynolds MRN: [id number] DOB: 29-Jan-1941 Today's Date: 01/05/2018    History of Present Illness Pt. is a 77 y.o. female who recently underwent a R THA. Pt. PMH includes HTN and visual deficits in R eye.    Clinical Impression   Pt. reports she was modified independent in ADLs PTA. Currently, pt. requires min guard-min assist during functional mobility and moderate assistance during LB ADLs. Pt. was educated on compensatory strategies for shower transfers and LB dressing. Pt. would benefit from training on how to complete shower transfer for safety upon return home. Recommendation is that pt. d/c home with spouse and with intermittent supervision for safety. Future OT sessions are needed to address LB ADLs and functional mobility. Pt. May or may not benefit from 3 in 1 to increase independence, will continue to assess.    Follow Up Recommendations  No OT follow up;Supervision - Intermittent    Equipment Recommendations  3 in 1 bedside commode(may not need; continue to assess)    Recommendations for Other Services       Precautions / Restrictions Precautions Precautions: Fall Restrictions Weight Bearing Restrictions: Yes RLE Weight Bearing: Weight bearing as tolerated      Mobility Bed Mobility               General bed mobility comments: Pt. was OOB in chair upon arrivall.  Transfers Overall transfer level: Needs assistance Equipment used: Rolling walker (2 wheeled) Transfers: Sit to/from Stand Sit to Stand: Min guard;Min assist         General transfer comment: Min assist for sit to stand from chair, min guard from toilet. Pt. was instructed on proper hand placement during transfer. Pt. took more time to complete transfer.    Balance Overall balance assessment: Needs assistance Sitting-balance support: Feet supported Sitting balance-Leahy Scale: Good     Standing balance support: Bilateral upper  extremity supported;During functional activity Standing balance-Leahy Scale: Poor Standing balance comment: RW for support                           ADL either performed or assessed with clinical judgement   ADL Overall ADL's : Needs assistance/impaired Eating/Feeding: Set up;Sitting   Grooming: Set up;Sitting;Wash/dry hands   Upper Body Bathing: Set up;Sitting   Lower Body Bathing: Moderate assistance;Cueing for safety;Cueing for compensatory techniques;Sit to/from stand   Upper Body Dressing : Set up;Supervision/safety;Sitting   Lower Body Dressing: Moderate assistance;Cueing for compensatory techniques;Cueing for safety;Sit to/from stand Lower Body Dressing Details (indicate cue type and reason): Pt. could use assitance getting LB garments started over feet. Educated on compensatory strategies Toilet Transfer: Min guard;Cueing for safety;RW;Ambulation   Toileting- ArchitectClothing Manipulation and Hygiene: Set up;Supervision/safety;Cueing for safety;Sitting/lateral lean Toileting - Clothing Manipulation Details (indicate cue type and reason): Pt. was able to complete peri care with min guard.   Tub/Shower Transfer Details (indicate cue type and reason): Pt. would benefit from education on compensatory techniques for tub transfer, but pt. intends to use walk-in shower at home. Pt. will be educated on compensatory techniques for approaching walk-in shower. Functional mobility during ADLs: Min guard;Cueing for safety;Rolling walker       Vision         Perception     Praxis      Pertinent Vitals/Pain Pain Assessment: Faces Faces Pain Scale: Hurts little more Pain Location: R hip Pain Descriptors / Indicators: Discomfort;Aching;Sore Pain Intervention(s): Limited activity within patient's tolerance;Monitored  during session;Patient requesting pain meds-RN notified;Ice applied     Hand Dominance     Extremity/Trunk Assessment Upper Extremity Assessment Upper Extremity  Assessment: Overall WFL for tasks assessed   Lower Extremity Assessment Lower Extremity Assessment: Defer to PT evaluation   Cervical / Trunk Assessment Cervical / Trunk Assessment: Normal   Communication Communication Communication: No difficulties   Cognition Arousal/Alertness: Awake/alert Behavior During Therapy: WFL for tasks assessed/performed Overall Cognitive Status: Within Functional Limits for tasks assessed                                     General Comments       Exercises     Shoulder Instructions      Home Living Family/patient expects to be discharged to:: Private residence Living Arrangements: Spouse/significant other Available Help at Discharge: Family Type of Home: House       Home Layout: One level     Bathroom Shower/Tub: Tub/shower unit;Walk-in shower(Plans to use walk in shower)   Bathroom Toilet: Standard     Home Equipment: Environmental consultant - 2 wheels;Cane - single point;Shower seat - built in          Prior Functioning/Environment Level of Independence: Independent with assistive device(s)        Comments: Using cane vs RW for mobility        OT Problem List: Decreased strength;Decreased activity tolerance;Impaired balance (sitting and/or standing);Decreased knowledge of use of DME or AE;Decreased knowledge of precautions;Pain      OT Treatment/Interventions: Self-care/ADL training;Energy conservation;DME and/or AE instruction;Therapeutic activities;Patient/family education;Balance training    OT Goals(Current goals can be found in the care plan section) Acute Rehab OT Goals Patient Stated Goal: return to independence OT Goal Formulation: With patient Time For Goal Achievement: 01/19/18 Potential to Achieve Goals: Good ADL Goals Pt Will Perform Lower Body Bathing: with supervision;sit to/from stand Pt Will Perform Lower Body Dressing: with supervision;sit to/from stand Pt Will Perform Tub/Shower Transfer: Shower  transfer;with supervision;ambulating;shower seat;rolling walker  OT Frequency: Min 2X/week   Barriers to D/C:            Co-evaluation              AM-PAC PT "6 Clicks" Daily Activity     Outcome Measure Help from another person eating meals?: None Help from another person taking care of personal grooming?: None Help from another person toileting, which includes using toliet, bedpan, or urinal?: A Little Help from another person bathing (including washing, rinsing, drying)?: A Lot Help from another person to put on and taking off regular upper body clothing?: None Help from another person to put on and taking off regular lower body clothing?: A Lot 6 Click Score: 19   End of Session Equipment Utilized During Treatment: Rolling walker  Activity Tolerance: Patient tolerated treatment well Patient left: in chair;with call bell/phone within reach;with family/visitor present  OT Visit Diagnosis: Unsteadiness on feet (R26.81);Other abnormalities of gait and mobility (R26.89);Pain;History of falling (Z91.81) Pain - Right/Left: Right Pain - part of body: Hip                Time: 1610-9604 OT Time Calculation (min): 25 min Charges:  OT General Charges $OT Visit: 1 Visit OT Evaluation $OT Eval Moderate Complexity: 1 Mod OT Treatments $Self Care/Home Management : 8-22 mins G-Codes:     Viola Placeres A. Brett Albino, M.S., OTR/L Pager: [phone]  Gaye Alken 01/05/2018, 2:45  PM

## 2018-01-05 NOTE — Progress Notes (Signed)
PROGRESS NOTE  Kiara Reynolds EAV:[id number] DOB: 01-29-1941 DOA: 01/04/2018 PCP: Patient, No Pcp Per  HPI/Recap of past 24 hours: 77 yr old female with a history of HTN (not on any meds), presents to the ED c/o R hip pain after a mechanical fall which happened about 10 days prior to presentation. Due to the persistent pain, pt saw her PCP who ordered an xray which showed R femoral neck fracture. Pt was advised to go to the hospital. Ortho was consulted and pt had Rt total hip arthroplasty on 01/04/18. Pt admitted for further management.  Today, pt reported post op R hip pain controlled with meds. Denies any SOB, chest pain, abdominal pain, N/V/D/C, fever/chills.  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 Acute blood loss s/p surgery Vs dilutional Repeat CBC, type and screen done Monitor closely  HTN Controlled Not on any meds at home    Code Status: Full  Family Communication: Husband at bedside  Disposition Plan: Home once stable   Consultants:  Orthopedics  Procedures:  R hip arthroplasty   Antimicrobials:  None  DVT prophylaxis:  Lovenox   Objective: Vitals:   01/04/18 2010 01/04/18 2024 01/05/18 0036 01/05/18 0608  BP:  (!) 137/57 (!) 109/47 (!) 110/51  Pulse:  80 81 91  Resp:      Temp: (!) 97.4 F (36.3 C) 97.7 F (36.5 C) 97.7 F (36.5 C) 97.8 F (36.6 C)  TempSrc:  Oral Oral Oral  SpO2:  97% 96% 97%  Weight:      Height:        Intake/Output Summary (Last 24 hours) at 01/05/2018 1147 Last data filed at 01/05/2018 1123 Gross per 24 hour  Intake 1250 ml  Output 650 ml  Net 600 ml   Filed Weights   01/04/18 1537  Weight:  65.3 kg (144 lb)    Exam:   General:  NAD  Cardiovascular: S1, S2 present  Respiratory: CTA  Abdomen: Soft, NT, ND, BS+  Musculoskeletal: Mild R hip pain, no pedal edema bilaterally  Skin: Normal  Psychiatry: Normal mood   Data Reviewed: CBC: Recent Labs  Lab 01/04/18 1255 01/05/18 0400  WBC 9.0 8.7  NEUTROABS 6.2  --   HGB 14.1 10.6*  HCT 42.3 32.3*  MCV 86.2 87.1  PLT 264 197   Basic Metabolic Panel: Recent Labs  Lab 01/04/18 1255 01/05/18 0400  NA 139 136  K 4.1 4.1  CL 106 104  CO2 22 23  GLUCOSE 109* 127*  BUN 16 16  CREATININE 0.66 0.73  CALCIUM 9.8 8.4*   GFR: Estimated Creatinine Clearance: 53.9 mL/min (by C-G formula based on SCr of 0.73 mg/dL). Liver Function Tests: No results for input(s): AST, ALT, ALKPHOS, BILITOT, PROT, ALBUMIN in the last 168 hours. No results for input(s): LIPASE, AMYLASE in the last 168 hours. No results for input(s): AMMONIA in the last 168 hours. Coagulation Profile: Recent Labs  Lab 01/04/18 1255  INR 0.98   Cardiac Enzymes: No results for input(s): CKTOTAL, CKMB, CKMBINDEX, TROPONINI in the last 168 hours. BNP (last 3 results) No results for input(s): PROBNP in the last 8760 hours. HbA1C: No results for input(s): HGBA1C in  the last 72 hours. CBG: No results for input(s): GLUCAP in the last 168 hours. Lipid Profile: No results for input(s): CHOL, HDL, LDLCALC, TRIG, CHOLHDL, LDLDIRECT in the last 72 hours. Thyroid Function Tests: No results for input(s): TSH, T4TOTAL, FREET4, T3FREE, THYROIDAB in the last 72 hours. Anemia Panel: No results for input(s): VITAMINB12, FOLATE, FERRITIN, TIBC, IRON, RETICCTPCT in the last 72 hours. Urine analysis: No results found for: COLORURINE, APPEARANCEUR, LABSPEC, PHURINE, GLUCOSEU, HGBUR, BILIRUBINUR, KETONESUR, PROTEINUR, UROBILINOGEN, NITRITE, LEUKOCYTESUR Sepsis Labs: @LABRCNTIP (procalcitonin:4,lacticidven:4)  )No results found for this or any previous visit (from  the past 240 hour(s)).    Studies: Dg Chest 1 View  Result Date: 01/04/2018 CLINICAL DATA:  77 y/o  F; right hip surgery today. EXAM: CHEST 1 VIEW COMPARISON:  None. FINDINGS: The heart size and mediastinal contours are within normal limits. Both lungs are clear. The visualized skeletal structures are unremarkable. IMPRESSION: No active disease. Electronically Signed   By: Mitzi Hansen M.D.   On: 01/04/2018 14:55   Pelvis Portable  Result Date: 01/04/2018 CLINICAL DATA:  Status post right hip replacement EXAM: PORTABLE PELVIS 1-2 VIEWS COMPARISON:  01/04/2018 FINDINGS: SI joints are patent. Pubic symphysis and rami are intact. Interval right hip replacement with normal alignment. Gas in the soft tissues of the right hip and thigh. Cutaneous staples. IMPRESSION: 1. Interval right hip replacement with normal alignment 2. Gas in the soft tissues of the lateral hip and thigh, consistent with recent post operative status Electronically Signed   By: Jasmine Pang M.D.   On: 01/04/2018 19:24   Dg C-arm 1-60 Min  Result Date: 01/04/2018 CLINICAL DATA:  77 y/o F; intraoperative fluoroscopy of right total hip arthroplasty with anterior approach. EXAM: DG C-ARM 61-120 MIN; OPERATIVE RIGHT HIP WITH PELVIS COMPARISON:  01/04/2018 pelvis and right hip radiographs. FINDINGS: Five intraoperative fluoroscopic images of anterior approach right total hip arthroplasty. Fluoro time is 39 seconds. The arthroplasty is well seated and there is postoperative air and edema in soft tissues. IMPRESSION: Right total hip arthroplasty intraoperative fluoroscopy. Fluoro time 39 seconds. Electronically Signed   By: Mitzi Hansen M.D.   On: 01/04/2018 18:03   Dg Hip Operative Unilat W Or W/o Pelvis Right  Result Date: 01/04/2018 CLINICAL DATA:  77 y/o F; intraoperative fluoroscopy of right total hip arthroplasty with anterior approach. EXAM: DG C-ARM 61-120 MIN; OPERATIVE RIGHT HIP WITH PELVIS COMPARISON:   01/04/2018 pelvis and right hip radiographs. FINDINGS: Five intraoperative fluoroscopic images of anterior approach right total hip arthroplasty. Fluoro time is 39 seconds. The arthroplasty is well seated and there is postoperative air and edema in soft tissues. IMPRESSION: Right total hip arthroplasty intraoperative fluoroscopy. Fluoro time 39 seconds. Electronically Signed   By: Mitzi Hansen M.D.   On: 01/04/2018 18:03   Dg Hip Unilat With Pelvis 2-3 Views Right  Result Date: 01/04/2018 CLINICAL DATA:  Acute RIGHT hip pain following fall 10 days ago. Initial encounter. EXAM: DG HIP (WITH OR WITHOUT PELVIS) 2-3V RIGHT COMPARISON:  None FINDINGS: A RIGHT femoral neck fracture is noted valgus angulation. There is no evidence of dislocation. The visualized portions of the LEFT hip are unremarkable. Degenerative changes in the LOWER lumbar spine are identified. IMPRESSION: RIGHT femoral neck fracture. Electronically Signed   By: Harmon Pier M.D.   On: 01/04/2018 13:20    Scheduled Meds: . docusate sodium  100 mg Oral BID  . enoxaparin (LOVENOX) injection  40 mg Subcutaneous Q24H  . pantoprazole  40 mg Oral Daily  Continuous Infusions: . sodium chloride    . sodium chloride 75 mL/hr at 01/04/18 2307  . lactated ringers    . methocarbamol (ROBAXIN)  IV       LOS: 1 day     Briant CedarNkeiruka J Sahir Tolson, MD [facility]  If 7PM-7AM, please contact night-coverage [email or web address] Password [email or web address] 01/05/2018, 11:47 AM

## 2018-01-05 NOTE — Progress Notes (Signed)
Nutrition Brief Note  Nutrition screen performed as part of the hip fx protocol.   Wt Readings from Last 15 Encounters:  01/04/18 144 lb (65.3 kg)   Body mass index is 26.34 kg/m. Patient meets criteria for overweigh based on current BMI.   Upon arrival, there are multiple family members present. She had surgery yesterday.   She says PTA she has had no trouble whatsoever with her appetite intake. Denies any weight or appetite changes.   There is no documented weight hx in chart.    She says her appetite has not quite rebounded since surgery and she is agreeable to supplementation.   No nutritional concerns detected on screen. If nutrition issues arise, please consult RD.   Christophe LouisNathan Phyliss Hulick RD, LDN, CNSC Clinical Nutrition Pager: [phone] 01/05/2018 3:25 PM

## 2018-01-05 NOTE — Plan of Care (Signed)
  Education: Knowledge of General Education information will improve 01/05/2018 0459 - Progressing by Olena Materobinson, Jessicamarie Amiri G, RN Note POC and orders reviewed with pt. and pain management.

## 2018-01-05 NOTE — Evaluation (Addendum)
Physical Therapy Evaluation Patient Details Name: Kiara Reynolds MRN: [id number] DOB: 10/30/1941 Today's Date: 01/05/2018   History of Present Illness  Pt. is a 77 y.o. female who recently underwent a R THA. Pt. PMH includes HTN and visual deficits in R eye.   Clinical Impression  Patient is s/p above surgery resulting in functional limitations due to the deficits listed below (see PT Problem List). PTA, pt living with husband in 1 story home with 5 stairs to enter. Upon eval pt presents with post op pain and weakness. Currently min guard for OOB mobility, ambulating moderate distances in hallway and participating in stair training tonight. Will need another session to reinforce stairs and ensure safety with husband guarding.  Patient will benefit from skilled PT to increase their independence and safety with mobility to allow discharge to the venue listed below.       Follow Up Recommendations Follow surgeon's recommendation for DC plan and follow-up therapies;Supervision for mobility/OOB    Equipment Recommendations  None recommended by PT    Recommendations for Other Services       Precautions / Restrictions Precautions Precautions: Fall Restrictions Weight Bearing Restrictions: Yes RLE Weight Bearing: Weight bearing as tolerated      Mobility  Bed Mobility               General bed mobility comments: Pt. was OOB in chair upon arival   Transfers Overall transfer level: Needs assistance Equipment used: Rolling walker (2 wheeled) Transfers: Sit to/from Stand Sit to Stand: Min assist         General transfer comment: min guard, patient with prolonged time and effort to stand but insisted she do it on her own.   Ambulation/Gait Ambulation/Gait assistance: Min guard;Supervision Ambulation Distance (Feet): 150 Feet Assistive device: Rolling walker (2 wheeled) Gait Pattern/deviations: Step-to pattern;Step-through pattern Gait velocity: decreased   General Gait  Details: intermittent step through pattern due to pain, cues for step length. Patient progressed to supervision this evening   Stairs Stairs: Yes Stairs assistance: Min guard Stair Management: One rail Left;Sideways(BUE support. ) Number of Stairs: 4 General stair comments: first trial of stairs, patient hesitant to do. BUE support on 1 rail with min guard, cues for seqeuncing and technique. needs to practice with husband present to ensure guarding.   Wheelchair Mobility    Modified Rankin (Stroke Patients Only)       Balance Overall balance assessment: Needs assistance Sitting-balance support: Feet supported Sitting balance-Leahy Scale: Good     Standing balance support: Bilateral upper extremity supported;During functional activity Standing balance-Leahy Scale: Poor Standing balance comment: RW for support                             Pertinent Vitals/Pain Pain Assessment: Faces Faces Pain Scale: Hurts little more Pain Location: R hip Pain Descriptors / Indicators: Discomfort;Aching;Sore Pain Intervention(s): Limited activity within patient's tolerance;Monitored during session;Premedicated before session;Repositioned    Home Living Family/patient expects to be discharged to:: Private residence Living Arrangements: Spouse/significant other Available Help at Discharge: Family Type of Home: House Home Access: Stairs to enter Entrance Stairs-Rails: Left;Right(can only reach 1 side at a time) Secretary/administrator of Steps: 5 Home Layout: One level Home Equipment: Walker - 2 wheels;Cane - single point;Shower seat - built in      Prior Function Level of Independence: Independent with assistive device(s)         Comments: Using cane vs RW for mobility  Hand Dominance        Extremity/Trunk Assessment   Upper Extremity Assessment Upper Extremity Assessment: Defer to OT evaluation    Lower Extremity Assessment Lower Extremity Assessment: RLE  deficits/detail(LLE strength 4/5) RLE Deficits / Details: pain and weakness consistent with above procedure        Communication   Communication: No difficulties  Cognition Arousal/Alertness: Awake/alert Behavior During Therapy: WFL for tasks assessed/performed Overall Cognitive Status: Within Functional Limits for tasks assessed                                        General Comments      Exercises General Exercises - Lower Extremity Ankle Circles/Pumps: 20 reps Quad Sets: 10 reps Heel Slides: 5 reps Hip ABduction/ADduction: 10 reps   Assessment/Plan    PT Assessment Patient needs continued PT services  PT Problem List Decreased strength;Decreased range of motion;Decreased activity tolerance;Decreased balance;Decreased mobility;Pain       PT Treatment Interventions DME instruction;Gait training;Stair training;Therapeutic activities;Functional mobility training;Therapeutic exercise    PT Goals (Current goals can be found in the Care Plan section)  Acute Rehab PT Goals Patient Stated Goal: return to independence PT Goal Formulation: With patient Time For Goal Achievement: 01/12/18 Potential to Achieve Goals: Good    Frequency Min 5X/week   Barriers to discharge        Co-evaluation               AM-PAC PT "6 Clicks" Daily Activity  Outcome Measure Difficulty turning over in bed (including adjusting bedclothes, sheets and blankets)?: A Little Difficulty moving from lying on back to sitting on the side of the bed? : A Little Difficulty sitting down on and standing up from a chair with arms (e.g., wheelchair, bedside commode, etc,.)?: A Little Help needed moving to and from a bed to chair (including a wheelchair)?: A Little Help needed walking in hospital room?: A Little Help needed climbing 3-5 steps with a railing? : A Little 6 Click Score: 18    End of Session Equipment Utilized During Treatment: Gait belt Activity Tolerance: Patient  tolerated treatment well Patient left: in chair;with call bell/phone within reach;with family/visitor present Nurse Communication: Mobility status PT Visit Diagnosis: Unsteadiness on feet (R26.81);Other abnormalities of gait and mobility (R26.89);Repeated falls (R29.6);Pain Pain - Right/Left: Right Pain - part of body: Hip    Time: 1655-1730 PT Time Calculation (min) (ACUTE ONLY): 35 min   Charges:   PT Evaluation $PT Eval Low Complexity: 1 Low PT Treatments $Gait Training: 8-22 mins   PT G Codes:        Etta GrandchildSean Eman Morimoto, PT, DPT Acute Rehab Services Pager: [phone]   Etta GrandchildSean  Samera Macy 01/05/2018, 10:15 PM

## 2018-01-05 NOTE — Progress Notes (Signed)
Subjective: 1 Day Post-Op Procedure(s) (LRB): TOTAL HIP ARTHROPLASTY ANTERIOR APPROACH (Right) Patient reports pain as mild.  No complaints.   Objective: Vital signs in last 24 hours: Temp:  [97.4 F (36.3 C)-98.4 F (36.9 C)] 97.8 F (36.6 C) (03/09 1243) Pulse Rate:  [66-101] 82 (03/09 1243) Resp:  [13-21] 18 (03/09 1243) BP: (109-139)/(42-87) 120/42 (03/09 1243) SpO2:  [96 %-100 %] 99 % (03/09 1243) Weight:  [144 lb (65.3 kg)] 144 lb (65.3 kg) (03/08 1537)  Intake/Output from previous day: 03/08 0701 - 03/09 0700 In: 1250 [I.V.:1250] Out: 350 [Urine:250; Blood:100] Intake/Output this shift: Total I/O In: -  Out: 700 [Urine:700]  Recent Labs    01/04/18 1255 01/05/18 0400  HGB 14.1 10.6*   Recent Labs    01/04/18 1255 01/05/18 0400  WBC 9.0 8.7  RBC 4.91 3.71*  HCT 42.3 32.3*  PLT 264 197   Recent Labs    01/04/18 1255 01/05/18 0400  NA 139 136  K 4.1 4.1  CL 106 104  CO2 22 23  BUN 16 16  CREATININE 0.66 0.73  GLUCOSE 109* 127*  CALCIUM 9.8 8.4*   Recent Labs    01/04/18 1255  INR 0.98   Right lower extremity: Sensation intact distally Intact pulses distally Dorsiflexion/Plantar flexion intact Incision: dressing C/D/I Compartment soft  Assessment/Plan: 1 Day Post-Op Procedure(s) (LRB): TOTAL HIP ARTHROPLASTY ANTERIOR APPROACH (Right) Up with therapy Discharge home with home health over the next few days.   GILBERT CLARK 01/05/2018, 12:53 PM

## 2018-01-06 DIAGNOSIS — Z8781 Personal history of (healed) traumatic fracture: Secondary | ICD-10-CM

## 2018-01-06 LAB — BASIC METABOLIC PANEL
ANION GAP: 7 (ref 5–15)
BUN: 8 mg/dL (ref 6–20)
CALCIUM: 8.8 mg/dL — AB (ref 8.9–10.3)
CO2: 24 mmol/L (ref 22–32)
CREATININE: 0.7 mg/dL (ref 0.44–1.00)
Chloride: 105 mmol/L (ref 101–111)
GFR calc Af Amer: >60 mL/min (ref >60)
GLUCOSE: 126 mg/dL — AB (ref 65–99)
Potassium: 4 mmol/L (ref 3.5–5.1)
Sodium: 136 mmol/L (ref 135–145)

## 2018-01-06 LAB — CBC
HCT: 32.4 % — ABNORMAL LOW (ref 36.0–46.0)
Hemoglobin: 10.8 g/dL — ABNORMAL LOW (ref 12.0–15.0)
MCH: 29 pg (ref 26.0–34.0)
MCHC: 33.3 g/dL (ref 30.0–36.0)
MCV: 86.9 fL (ref 78.0–100.0)
PLATELETS: 174 10*3/uL (ref 150–400)
RBC: 3.73 MIL/uL — ABNORMAL LOW (ref 3.87–5.11)
RDW: 13.3 % (ref 11.5–15.5)
WBC: 9.4 10*3/uL (ref 4.0–10.5)

## 2018-01-06 MED ORDER — METHOCARBAMOL 500 MG PO TABS: 500.0000 mg | ORAL_TABLET | Freq: Four times a day (QID) | ORAL | 1 refills | Status: DC | PRN

## 2018-01-06 MED ORDER — ASPIRIN 81 MG PO CHEW: 81.0000 mg | CHEWABLE_TABLET | Freq: Two times a day (BID) | ORAL | Status: DC

## 2018-01-06 MED ORDER — HYDROCODONE-ACETAMINOPHEN 5-325 MG PO TABS: 1.0000 | ORAL_TABLET | ORAL | 0 refills | Status: AC | PRN

## 2018-01-06 MED ORDER — ENOXAPARIN SODIUM 40 MG/0.4ML ~~LOC~~ SOLN: 40.0000 mg | SUBCUTANEOUS | 0 refills | Status: DC

## 2018-01-06 MED ORDER — ASPIRIN 81 MG PO CHEW: 81.0000 mg | CHEWABLE_TABLET | Freq: Two times a day (BID) | ORAL | 0 refills | Status: AC

## 2018-01-06 MED ORDER — ASPIRIN EC 81 MG PO TBEC: 81.0000 mg | DELAYED_RELEASE_TABLET | Freq: Every day | ORAL | 0 refills | Status: DC

## 2018-01-06 NOTE — Progress Notes (Signed)
Physical Therapy Treatment Patient Details Name: Kiara Reynolds MRN: [id number] DOB: 12/11/1940 Today's Date: 01/06/2018    History of Present Illness Pt. is a 77 y.o. female who recently underwent a R THA. Pt. PMH includes HTN and visual deficits in R eye.     PT Comments    Patient motivated to work with PT this AM. Husband present for session to ensure proper guarding within the home environment. Patient much more tolerable to gait and stair training today without limitations due to pain or fatigue. Able to ascend/descend 4 steps with step to pattern and single hand rail without LOB. Education on proper use of AD and guarding/safety within the home with good carryover by both patient and husband.     Follow Up Recommendations  Follow surgeon's recommendation for DC plan and follow-up therapies;Supervision for mobility/OOB     Equipment Recommendations  None recommended by PT    Recommendations for Other Services       Precautions / Restrictions Precautions Precautions: Fall Restrictions Weight Bearing Restrictions: Yes RLE Weight Bearing: Weight bearing as tolerated    Mobility  Bed Mobility Overal bed mobility: Needs Assistance Bed Mobility: Sit to Supine       Sit to supine: Min assist   General bed mobility comments: for LE management into bed  Transfers Overall transfer level: Needs assistance Equipment used: Rolling walker (2 wheeled) Transfers: Sit to/from Stand Sit to Stand: Min guard         General transfer comment: increased time and effort; min guard for safety from recliner  Ambulation/Gait Ambulation/Gait assistance: Min guard;Supervision Ambulation Distance (Feet): 300 Feet Assistive device: Rolling walker (2 wheeled) Gait Pattern/deviations: Step-through pattern;Decreased stride length;Decreased weight shift to right;Antalgic     General Gait Details: increased gait speed; some VC to remain close to AD   Stairs Stairs: Yes   Stair  Management: One rail Right;Step to pattern;Sideways Number of Stairs: 4 General stair comments: B UE support on single handrail; VC for sequencing and for general safety; husband present with good carryover  Wheelchair Mobility    Modified Rankin (Stroke Patients Only)       Balance Overall balance assessment: Needs assistance Sitting-balance support: Feet supported;No upper extremity supported Sitting balance-Leahy Scale: Good     Standing balance support: Bilateral upper extremity supported;During functional activity Standing balance-Leahy Scale: Fair Standing balance comment: RW for support                            Cognition Arousal/Alertness: Awake/alert Behavior During Therapy: WFL for tasks assessed/performed Overall Cognitive Status: Within Functional Limits for tasks assessed                                        Exercises      General Comments        Pertinent Vitals/Pain Pain Assessment: Faces Faces Pain Scale: Hurts little more Pain Location: R hip Pain Descriptors / Indicators: Tightness;Sore Pain Intervention(s): Limited activity within patient's tolerance;Monitored during session;Repositioned    Home Living                      Prior Function            PT Goals (current goals can now be found in the care plan section) Acute Rehab PT Goals Patient Stated Goal: return to independence PT  Goal Formulation: With patient Time For Goal Achievement: 01/12/18 Potential to Achieve Goals: Good Progress towards PT goals: Progressing toward goals    Frequency    Min 5X/week      PT Plan Current plan remains appropriate    Co-evaluation              AM-PAC PT "6 Clicks" Daily Activity  Outcome Measure  Difficulty turning over in bed (including adjusting bedclothes, sheets and blankets)?: A Little Difficulty moving from lying on back to sitting on the side of the bed? : A Lot Difficulty sitting down  on and standing up from a chair with arms (e.g., wheelchair, bedside commode, etc,.)?: Unable Help needed moving to and from a bed to chair (including a wheelchair)?: A Little Help needed walking in hospital room?: A Little Help needed climbing 3-5 steps with a railing? : A Little 6 Click Score: 15    End of Session Equipment Utilized During Treatment: Gait belt Activity Tolerance: Patient tolerated treatment well Patient left: in bed;with call bell/phone within reach;with family/visitor present Nurse Communication: Mobility status PT Visit Diagnosis: Unsteadiness on feet (R26.81);Other abnormalities of gait and mobility (R26.89);Repeated falls (R29.6);Pain Pain - Right/Left: Right Pain - part of body: Hip     Time: 1610-9604 PT Time Calculation (min) (ACUTE ONLY): 29 min  Charges:  $Gait Training: 8-22 mins $Therapeutic Activity: 8-22 mins                    G Codes:       Kipp Laurence, PT, DPT 01/06/18 10:53 AM

## 2018-01-06 NOTE — Discharge Summary (Signed)
Patient ID: Kiara Reynolds MRN: [id number] DOB/AGE: 1940/12/28 76 y.o.  Admit date: 01/04/2018 Discharge date: 01/06/2018  Admission Diagnoses:  Active Problems:   Hip fx (HCC)   Hypertension   Right hip pain   Displaced fracture of base of neck of right femur, initial encounter for closed fracture [facility])   Discharge Diagnoses:  Same  Past Medical History:  Diagnosis Date  . Blind right eye   . Hip fx, right, closed, initial encounter (HCC)   . Hypertension    not on antihypertensive med    Surgeries: Procedure(s): TOTAL HIP ARTHROPLASTY ANTERIOR APPROACH on 01/04/2018   Consultants: Treatment Team:  Kathryne Hitch, MD  Discharged Condition: Improved  Hospital Course: Kiara Reynolds is an 77 y.o. female who was admitted 01/04/2018 for operative treatment of<principal problem not specified>. Patient has severe unremitting pain that affects sleep, daily activities, and work/hobbies. After pre-op clearance the patient was taken to the operating room on 01/04/2018 and underwent  Procedure(s): TOTAL HIP ARTHROPLASTY ANTERIOR APPROACH.    Patient was given perioperative antibiotics:  Anti-infectives (From admission, onward)   Start     Dose/Rate Route Frequency Ordered Stop   01/05/18 0600  clindamycin (CLEOCIN) IVPB 900 mg     900 mg 100 mL/hr over 30 Minutes Intravenous On call to O.R. 01/04/18 1428 01/04/18 1709   01/04/18 2045  clindamycin (CLEOCIN) IVPB 600 mg     600 mg 100 mL/hr over 30 Minutes Intravenous Every 6 hours 01/04/18 2035 01/05/18 0317   01/04/18 1516  clindamycin (CLEOCIN) 900 MG/50ML IVPB    Comments:  Marrianne Mood   : cabinet override      01/04/18 1516 01/04/18 1654       Patient was given sequential compression devices, early ambulation, and chemoprophylaxis to prevent DVT.  Patient benefited maximally from hospital stay and there were no complications.    Recent vital signs:  Patient Vitals for the past 24 hrs:  BP Temp Temp src Pulse  Resp SpO2  01/06/18 0629 (!) 146/59 98.3 F (36.8 C) Oral 100 18 96 %  01/05/18 2104 (!) 131/47 97.8 F (36.6 C) Oral 100 18 100 %  01/05/18 1243 (!) 120/42 97.8 F (36.6 C) Oral 82 18 99 %     Recent laboratory studies:  Recent Labs    01/04/18 1255 01/05/18 0400 01/05/18 1540 01/06/18 0637  WBC 9.0 8.7  --  9.4  HGB 14.1 10.6* 10.9* 10.8*  HCT 42.3 32.3* 32.6* 32.4*  PLT 264 197  --  174  NA 139 136  --  136  K 4.1 4.1  --  4.0  CL 106 104  --  105  CO2 22 23  --  24  BUN 16 16  --  8  CREATININE 0.66 0.73  --  0.70  GLUCOSE 109* 127*  --  126*  INR 0.98  --   --   --   CALCIUM 9.8 8.4*  --  8.8*     Discharge Medications:   Allergies as of 01/06/2018      Reactions   Penicillins    Aspirin Other (See Comments)   Atropine Other (See Comments)   Cyclopentolate Other (See Comments)   Eye red, matted and burned      Medication List    TAKE these medications   aspirin EC 81 MG tablet Take 1 tablet (81 mg total) by mouth daily for 21 days.   dorzolamide-timolol 22.3-6.8 MG/ML ophthalmic solution Commonly known as:  COSOPT  Place 1 drop into both eyes 2 (two) times daily.   HYDROcodone-acetaminophen 5-325 MG tablet Commonly known as:  NORCO/VICODIN Take 1-2 tablets by mouth every 4 (four) hours as needed for up to 7 days for moderate pain (pain score 4-6).   latanoprost 0.005 % ophthalmic solution Commonly known as:  XALATAN Place 1 drop into both eyes at bedtime.   methocarbamol 500 MG tablet Commonly known as:  ROBAXIN Take 1 tablet (500 mg total) by mouth every 6 (six) hours as needed for muscle spasms.       Diagnostic Studies: Dg Chest 1 View  Result Date: 01/04/2018 CLINICAL DATA:  77 y/o  F; right hip surgery today. EXAM: CHEST 1 VIEW COMPARISON:  None. FINDINGS: The heart size and mediastinal contours are within normal limits. Both lungs are clear. The visualized skeletal structures are unremarkable. IMPRESSION: No active disease. Electronically  Signed   By: Mitzi HansenLance  Furusawa-Stratton M.D.   On: 01/04/2018 14:55   Pelvis Portable  Result Date: 01/04/2018 CLINICAL DATA:  Status post right hip replacement EXAM: PORTABLE PELVIS 1-2 VIEWS COMPARISON:  01/04/2018 FINDINGS: SI joints are patent. Pubic symphysis and rami are intact. Interval right hip replacement with normal alignment. Gas in the soft tissues of the right hip and thigh. Cutaneous staples. IMPRESSION: 1. Interval right hip replacement with normal alignment 2. Gas in the soft tissues of the lateral hip and thigh, consistent with recent post operative status Electronically Signed   By: Jasmine PangKim  Fujinaga M.D.   On: 01/04/2018 19:24   Dg C-arm 1-60 Min  Result Date: 01/04/2018 CLINICAL DATA:  77 y/o F; intraoperative fluoroscopy of right total hip arthroplasty with anterior approach. EXAM: DG C-ARM 61-120 MIN; OPERATIVE RIGHT HIP WITH PELVIS COMPARISON:  01/04/2018 pelvis and right hip radiographs. FINDINGS: Five intraoperative fluoroscopic images of anterior approach right total hip arthroplasty. Fluoro time is 39 seconds. The arthroplasty is well seated and there is postoperative air and edema in soft tissues. IMPRESSION: Right total hip arthroplasty intraoperative fluoroscopy. Fluoro time 39 seconds. Electronically Signed   By: Mitzi HansenLance  Furusawa-Stratton M.D.   On: 01/04/2018 18:03   Dg Hip Operative Unilat W Or W/o Pelvis Right  Result Date: 01/04/2018 CLINICAL DATA:  77 y/o F; intraoperative fluoroscopy of right total hip arthroplasty with anterior approach. EXAM: DG C-ARM 61-120 MIN; OPERATIVE RIGHT HIP WITH PELVIS COMPARISON:  01/04/2018 pelvis and right hip radiographs. FINDINGS: Five intraoperative fluoroscopic images of anterior approach right total hip arthroplasty. Fluoro time is 39 seconds. The arthroplasty is well seated and there is postoperative air and edema in soft tissues. IMPRESSION: Right total hip arthroplasty intraoperative fluoroscopy. Fluoro time 39 seconds. Electronically  Signed   By: Mitzi HansenLance  Furusawa-Stratton M.D.   On: 01/04/2018 18:03   Dg Hip Unilat With Pelvis 2-3 Views Right  Result Date: 01/04/2018 CLINICAL DATA:  Acute RIGHT hip pain following fall 10 days ago. Initial encounter. EXAM: DG HIP (WITH OR WITHOUT PELVIS) 2-3V RIGHT COMPARISON:  None FINDINGS: A RIGHT femoral neck fracture is noted valgus angulation. There is no evidence of dislocation. The visualized portions of the LEFT hip are unremarkable. Degenerative changes in the LOWER lumbar spine are identified. IMPRESSION: RIGHT femoral neck fracture. Electronically Signed   By: Harmon PierJeffrey  Hu M.D.   On: 01/04/2018 13:20    Disposition: Final discharge disposition not confirmed    Follow-up Information    Kathryne HitchBlackman, Christopher Y, MD. Schedule an appointment as soon as possible for a visit in 2 week(s).   Specialty:  Orthopedic Surgery Contact information: [address] [place] [address] [phone]            Signed: Richardean Canal 01/06/2018, 10:02 AM

## 2018-01-06 NOTE — Care Management Note (Signed)
Case Management Note  Patient Details  Name: Alberteen Samlizabeth Janish MRN: [id number] Date of Birth: June 06, 1941  Subjective/Objective:              Pt presented for right total hip arthroplasty after right hip fx.  Pt at home with husband who is available 24/7.  Pt has walker and 3n1 at home.  No therapy is recommended.   Action/Plan: Discussed HH plan with patient and husband on the phone and at the bedside.  They seem anxious about going home without therapy.  [facility] but they both state that if PT here does not recommend they will [facility] to [facility].   Husband states he feels more confident since they worked with therapist this morning and patient walked a long distance and practiced stairs.  Pt states she will call office if she finds she needs therapy at home.   Expected Discharge Date:  01/06/18               Expected Discharge Plan:  Home/Self Care  In-House Referral:  NA  Discharge planning Services  CM Consult  Post Acute Care Choice:  NA Choice offered to:     DME Arranged:  N/A DME Agency:     HH Arranged:  NA HH Agency:     Status of Service:  Completed, signed off  If discussed at Long Length of Stay Meetings, dates discussed:    Additional Comments:  Deveron Furlongshley  Ketina Mars, RN 01/06/2018, 1:16 PM

## 2018-01-06 NOTE — Progress Notes (Signed)
Subjective: 2 Days Post-Op Procedure(s) (LRB): TOTAL HIP ARTHROPLASTY ANTERIOR APPROACH (Right) Patient reports pain as mild.  No complaints. Wants to discharge to home today if she does well with PT.  Still needs to stairs, has 4 steps to get into home.   Objective: Vital signs in last 24 hours: Temp:  [97.8 F (36.6 C)-98.3 F (36.8 C)] 98.3 F (36.8 C) (03/10 0629) Pulse Rate:  [82-100] 100 (03/10 0629) Resp:  [18] 18 (03/10 0629) BP: (120-146)/(42-59) 146/59 (03/10 0629) SpO2:  [96 %-100 %] 96 % (03/10 0629)  Intake/Output from previous day: 03/09 0701 - 03/10 0700 In: 480 [P.O.:480] Out: 700 [Urine:700] Intake/Output this shift: Total I/O In: 240 [P.O.:240] Out: -   Recent Labs    01/04/18 1255 01/05/18 0400 01/05/18 1540 01/06/18 0637  HGB 14.1 10.6* 10.9* 10.8*   Recent Labs    01/05/18 0400 01/05/18 1540 01/06/18 0637  WBC 8.7  --  9.4  RBC 3.71*  --  3.73*  HCT 32.3* 32.6* 32.4*  PLT 197  --  174   Recent Labs    01/05/18 0400 01/06/18 0637  NA 136 136  K 4.1 4.0  CL 104 105  CO2 23 24  BUN 16 8  CREATININE 0.73 0.70  GLUCOSE 127* 126*  CALCIUM 8.4* 8.8*   Recent Labs    01/04/18 1255  INR 0.98    Sensation intact distally Intact pulses distally Dorsiflexion/Plantar flexion intact Incision: dressing C/D/I Compartment soft  Assessment/Plan: 2 Days Post-Op Procedure(s) (LRB): TOTAL HIP ARTHROPLASTY ANTERIOR APPROACH (Right) Up with therapy  If patient does well with PT and is safe , discharge to home later today. Otherwise will discharge home tomorrow after PT.   GILBERT CLARK 01/06/2018, 9:33 AM

## 2018-01-06 NOTE — Progress Notes (Signed)
Sent text paged to CM due to scheduling outpatient PT for patient before discharged. Awaiting response.

## 2018-01-06 NOTE — Discharge Instructions (Signed)

## 2018-01-06 NOTE — Progress Notes (Signed)
PROGRESS NOTE  Kiara Reynolds ZOX:[id number] DOB: [birth year] DOA: 01/04/2018 PCP: Patient, No Pcp Per  HPI/Recap of past 24 hours: 77 yr old female with a history of HTN (not on any meds), presents to the ED c/o R hip pain after a mechanical fall which happened about 10 days prior to presentation. Due to the persistent pain, pt saw her PCP who ordered an xray which showed R femoral neck fracture. Pt was advised to go to the hospital. Ortho was consulted and pt had Rt total hip arthroplasty on 01/04/18. Pt admitted for further management.  Today, pt reported post op R hip pain controlled with meds. Denies any new complaints. Pt already discharged by Ortho team.   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 Pt currently stable, afebrile, no leukcoytosis Ortho team discharged pt  Normocytic anemia Stable Hgb 14.1-->10.6-->10.8 Acute blood loss s/p surgery Vs dilutional  HTN Controlled Not on any meds at home    Code Status: Full  Family Communication: Husband at bedside  Disposition Plan: Discharged by ortho team   Consultants:  Orthopedics  Procedures:  R hip arthroplasty   Antimicrobials:  None  DVT prophylaxis:  Lovenox   Objective: Vitals:   01/05/18 0608 01/05/18 1243 01/05/18 2104 01/06/18 0629  BP: (!) 110/51 (!) 120/42 (!) 131/47 (!) 146/59  Pulse: 91 82 100 100  Resp:  18 18 18   Temp: 97.8 F (36.6 C) 97.8 F (36.6 C) 97.8 F (36.6 C) 98.3 F (36.8 C)  TempSrc: Oral Oral Oral Oral  SpO2: 97% 99% 100% 96%  Weight:      Height:        Intake/Output Summary (Last 24 hours) at 01/06/2018 1212 Last data filed at 01/06/2018 0847 Gross per 24 hour  Intake 720 ml  Output 200 ml  Net 520 ml   Filed Weights   01/04/18 1537  Weight: 65.3 kg  (144 lb)    Exam:   General:  NAD  Cardiovascular: S1, S2 present  Respiratory: CTA  Abdomen: Soft, NT, ND, BS+  Musculoskeletal: Mild R hip pain, no pedal edema bilaterally  Skin: Normal  Psychiatry: Normal mood   Data Reviewed: CBC: Recent Labs  Lab 01/04/18 1255 01/05/18 0400 01/05/18 1540 01/06/18 0637  WBC 9.0 8.7  --  9.4  NEUTROABS 6.2  --   --   --   HGB 14.1 10.6* 10.9* 10.8*  HCT 42.3 32.3* 32.6* 32.4*  MCV 86.2 87.1  --  86.9  PLT 264 197  --  174   Basic Metabolic Panel: Recent Labs  Lab 01/04/18 1255 01/05/18 0400 01/06/18 0637  NA 139 136 136  K 4.1 4.1 4.0  CL 106 104 105  CO2 22 23 24   GLUCOSE 109* 127* 126*  BUN 16 16 8   CREATININE 0.66 0.73 0.70  CALCIUM 9.8 8.4* 8.8*   GFR: Estimated Creatinine Clearance: 53.9 mL/min (by C-G formula based on SCr of 0.7 mg/dL). Liver Function Tests: No results for input(s): AST, ALT, ALKPHOS, BILITOT, PROT, ALBUMIN in the last 168 hours. No results for input(s): LIPASE, AMYLASE in the last 168 hours. No results for input(s): AMMONIA in the last 168 hours. Coagulation Profile: Recent Labs  Lab 01/04/18 1255  INR 0.98   Cardiac Enzymes: No results for  input(s): CKTOTAL, CKMB, CKMBINDEX, TROPONINI in the last 168 hours. BNP (last 3 results) No results for input(s): PROBNP in the last 8760 hours. HbA1C: No results for input(s): HGBA1C in the last 72 hours. CBG: No results for input(s): GLUCAP in the last 168 hours. Lipid Profile: No results for input(s): CHOL, HDL, LDLCALC, TRIG, CHOLHDL, LDLDIRECT in the last 72 hours. Thyroid Function Tests: No results for input(s): TSH, T4TOTAL, FREET4, T3FREE, THYROIDAB in the last 72 hours. Anemia Panel: No results for input(s): VITAMINB12, FOLATE, FERRITIN, TIBC, IRON, RETICCTPCT in the last 72 hours. Urine analysis:    Component Value Date/Time   COLORURINE STRAW (A) 01/04/2018 1404   APPEARANCEUR CLEAR 01/04/2018 1404   LABSPEC 1.006 01/04/2018  1404   PHURINE 5.0 01/04/2018 1404   GLUCOSEU NEGATIVE 01/04/2018 1404   HGBUR SMALL (A) 01/04/2018 1404   BILIRUBINUR NEGATIVE 01/04/2018 1404   KETONESUR NEGATIVE 01/04/2018 1404   PROTEINUR NEGATIVE 01/04/2018 1404   NITRITE NEGATIVE 01/04/2018 1404   LEUKOCYTESUR NEGATIVE 01/04/2018 1404   Sepsis Labs: @LABRCNTIP (procalcitonin:4,lacticidven:4)  )No results found for this or any previous visit (from the past 240 hour(s)).    Studies: No results found.  Scheduled Meds: . aspirin  81 mg Oral BID PC  . docusate sodium  100 mg Oral BID  . feeding supplement (ENSURE ENLIVE)  237 mL Oral BID BM  . pantoprazole  40 mg Oral Daily    Continuous Infusions: . methocarbamol (ROBAXIN)  IV       LOS: 2 days     Briant Cedar, MD [facility]  If 7PM-7AM, please contact night-coverage [email or web address] Password [email or web address] 01/06/2018, 12:12 PM

## 2018-01-06 NOTE — Progress Notes (Signed)
Patient ID: Kiara Reynolds, female   DOB: 07-01-1941, 77 y.o.   MRN: [id number] The patient is doing well status post a hip replacement.  She is on Lovenox but I would rather have her on a baby aspirin twice a day.  There is too much side effects after hip replacement from a bleeding standpoint with Lovenox and Xarelto and those types of medications.  She is very active and showing excellent mobility.  I spoke to her in detail about her aspirin allergy and apparently this was only told to her 45 years ago when she was in the hospital with a pregnancy and a viral illness.  The physician at the time gave her aspirin as well as penicillin.  She had a rash all over her body and just some slight eye swelling and only one eye with no throat swelling or swallowing difficulties which the physician told her it was from the aspirin.  She has been on aspirin plenty of times before this with no adverse effects.  From that history I feel comfortable with having her on an 81 mg aspirin twice a day as well as TED hose and pumping her feet.

## 2018-01-06 NOTE — Progress Notes (Signed)
OT Cancellation Note  Patient Details Name: Kiara Reynolds MRN: [id number] DOB: 1941/05/06   Cancelled Treatment:    Reason Eval/Treat Not Completed: Other (comment).  Pt. Declined skilled OT for shower stall transfer stating she was preparing to d/c home shortly and was very tired.  Also states she was able to complete shower transfer with assistance from her husband and felt she did not need the review.    Robet LeuMorris, Natisha Trzcinski Lorraine, COTA/L 01/06/2018, 11:42 AM

## 2018-01-06 NOTE — Progress Notes (Addendum)
Kiara Reynolds to be D/C'd Home per MD order.  Discussed prescriptions and follow up appointments with the patient. Prescriptions given to patient, medication list explained in detail. Pt verbalized understanding.  Allergies as of 01/06/2018      Reactions   Penicillins    Aspirin Other (See Comments)   Atropine Other (See Comments)   Cyclopentolate Other (See Comments)   Eye red, matted and burned      Medication List    TAKE these medications   aspirin 81 MG chewable tablet Chew 1 tablet (81 mg total) by mouth 2 (two) times daily after a meal.   dorzolamide-timolol 22.3-6.8 MG/ML ophthalmic solution Commonly known as:  COSOPT Place 1 drop into both eyes 2 (two) times daily.   HYDROcodone-acetaminophen 5-325 MG tablet Commonly known as:  NORCO/VICODIN Take 1-2 tablets by mouth every 4 (four) hours as needed for up to 7 days for moderate pain (pain score 4-6).   latanoprost 0.005 % ophthalmic solution Commonly known as:  XALATAN Place 1 drop into both eyes at bedtime.   methocarbamol 500 MG tablet Commonly known as:  ROBAXIN Take 1 tablet (500 mg total) by mouth every 6 (six) hours as needed for muscle spasms.       Vitals:   01/05/18 2104 01/06/18 0629  BP: (!) 131/47 (!) 146/59  Pulse: 100 100  Resp: 18 18  Temp: 97.8 F (36.6 C) 98.3 F (36.8 C)  SpO2: 100% 96%    Skin clean, dry and intact without evidence of skin break down, no evidence of skin tears noted. Aquacel dressing on right hip, clean, dry, and intact. TED hose on, clean, dry, and intact. Patient educated on TED hose prior to discharge. IV catheter discontinued intact. Site without signs and symptoms of complications. Dressing and pressure applied. Pt denies pain at this time. No complaints noted.  An After Visit Summary and prescriptions were printed and given to the patient. Patient escorted via WC, and D/C home via private auto.  GrenadaBrittany Noni Stonesifer RN

## 2018-01-07 ENCOUNTER — Encounter (HOSPITAL_COMMUNITY): Payer: Self-pay | Admitting: Orthopaedic Surgery

## 2018-01-07 NOTE — Op Note (Signed)
NAME:  Kiara Reynolds, [name]            ACCOUNT NO.:  [id number]  MEDICAL RECORD NO.:  [id number]  LOCATION:                                 FACILITY:  PHYSICIAN:  Vanita PandaChristopher Y. Magnus IvanBlackman, M.D.DATE OF BIRTH:  1940/11/13  DATE OF PROCEDURE:  01/04/2018 DATE OF DISCHARGE:  01/06/2018                              OPERATIVE REPORT   PREOPERATIVE DIAGNOSIS:  Right hip femoral neck fracture.  POSTOPERATIVE DIAGNOSIS:  Right hip femoral neck fracture.  PROCEDURE:  Right total hip arthroplasty through direct anterior approach.  IMPLANTS:  DePuy Sector Gription acetabular component size 50, size 32 +0 polyethylene liner, size 11 Corail femoral component with standard offset, size 32 +1 ceramic hip ball.  SURGEON:  Vanita PandaChristopher Y. Magnus IvanBlackman, M.D.  ASSISTANT:  Richardean CanalGilbert Clark, P.A.-C.  ANESTHESIA:  Spinal.  ANTIBIOTICS:  2 g of IV Ancef.  BLOOD LOSS:  Less than 300 mL.  COMPLICATIONS:  None.  INDICATIONS:  [name] is a very pleasant 77 year old female, who went to urgent care today just over a week after falling on her right hip with continued pain and difficulty mobilizing due to hip pain.  An x-ray was obtained and it showed an impacted femoral neck fracture.  Due to the impacted nature of this fracture and her young age, we have recommended a total hip arthroplasty with direct anterior approach.  We had a long and thorough discussion about this surgery; about the risks and benefits as well as nonoperative and operative treatment options.  I do feel this is the best option for her given her young age and the impaction of this fracture.  We discussed the risk of acute blood loss anemia, nerve and vessel injury, fracture, infection, dislocation, and DVT.  She understands our goals are to decrease pain, improve mobility, and overall improve quality of life.  PROCEDURE DESCRIPTION:  After informed consent was obtained, appropriate right hip was marked.  She was brought to the operating  room where spinal anesthesia was obtained while she was on her stretcher.  She was then laid in a supine position on the operating table with perineal post in place and both legs in InLine skeletal traction devices, but no traction applied.  Her right operative hip was prepped and draped with DuraPrep and sterile drapes.  Time-out was called and she was identified as correct patient and correct right hip.  We then made incision just inferior and posterior to the anterior superior iliac spine and carried this obliquely down the leg.  We dissected down to tensor fascia lata muscle.  The tensor fascia then divided longitudinally to proceed with a direct anterior approach to the hip.  We identified and cauterized the circumflex vessels and then identified the hip capsule.  I opened up the capsule finding a hematoma consistent with a hip fracture and you could see the impacted femoral neck fracture.  We placed Cobra retractor on the medial and lateral femoral neck, and then made our femoral neck cut proximal to the lesser trochanter and completed this with an osteotome. I placed a corkscrew guide in the femoral head and removed the femoral head in its entirety.  We then cleaned the acetabular remnants of the acetabular labrum  and other debris and then placed a bent Hohmann over the medial acetabular rim.  I then began reaming from a size 42 reamer in stepwise increments going up to a size 49 with all reamers under direct visualization, the last reamer under direct fluoroscopy, so we could obtain our depth of reaming, our inclination, and anteversion. Once I was pleased with this, I placed the real DePuy Sector Gription acetabular component size 50 under direct fluoroscopy and visualization and I was pleased with this placement.  We then placed the real 32 +0 polyethylene liner for that size acetabular component.  Attention was then turned to the femur.  With the leg externally rotated to  120 degrees, extended, and adducted, we were able to place a Mueller retractor medially and a Hohmann retractor behind the greater trochanter.  We released the lateral joint capsule and used a box cutting osteotome to enter the femoral canal and a rongeur to lateralize.  We then began broaching from a size 8 broach using the Corail broaching system going up to a size 11.  With the size 11 in place, we trialed a standard offset femoral neck and a 32 +1 hip ball and reduced in the pelvis.  We were pleased with range of motion and stability, but I felt like her offset and leg length were a little bit long, so we could not go with a varus offset femoral neck so to give her too much offset although it decreased her length.  We dislocated the hip and removed the trial components.  I trialed to lateralize more, but we could not get the stem in any better position than it already was.  We placed the real Corail femoral component size 11 and the real 32 +1 hip ball and reduced this in the acetabulum and it was stable.  We then irrigated the soft tissue with normal saline solution using pulsatile lavage.  We closed the joint capsule with interrupted #1 Ethibond suture, followed by running #1 Vicryl in tensor fascia, 0 Vicryl in the deep tissue, 2-0 Vicryl in the subcutaneous tissue, interrupted staples on the skin.  Xeroform and Aquacel dressing were applied.  She was taken off the Hana table and taken to the recovery room in stable condition. All final counts were correct.  There were no complications noted.  Of note, Richardean Canal, P.A.-C. assisted the entire case.  His assistance was crucial for facilitating all aspects of this case.     Vanita Panda. Magnus Ivan, M.D.     CYB/MEDQ  D:  01/04/2018  T:  01/05/2018  Job:  [id number]

## 2018-01-08 ENCOUNTER — Telehealth (INDEPENDENT_AMBULATORY_CARE_PROVIDER_SITE_OTHER): Payer: Self-pay | Admitting: Orthopaedic Surgery

## 2018-01-08 NOTE — Telephone Encounter (Signed)
Patient called saying that her Aqua seal wrap is starting to come off and it's already been replaced once but was wondering where they could go to get another spare, just incase. CB # [phone]

## 2018-01-08 NOTE — Telephone Encounter (Signed)
Patient husband aware that they can just do a dry guaze until we see them again

## 2018-01-17 ENCOUNTER — Encounter (INDEPENDENT_AMBULATORY_CARE_PROVIDER_SITE_OTHER): Payer: Self-pay | Admitting: Physician Assistant

## 2018-01-17 ENCOUNTER — Ambulatory Visit (INDEPENDENT_AMBULATORY_CARE_PROVIDER_SITE_OTHER): Payer: Medicare Other | Admitting: Physician Assistant

## 2018-01-17 DIAGNOSIS — Z96641 Presence of right artificial hip joint: Secondary | ICD-10-CM

## 2018-01-17 NOTE — Progress Notes (Signed)
Office Visit Note   Patient: Kiara Reynolds           Date of Birth: 24-Jun-1941           MRN: [id number] Visit Date: 01/17/2018              Requested by: No referring provider defined for this encounter. PCP: Patient, No Pcp Per   Assessment & Plan: Visit Diagnoses:  1. Status post total replacement of right hip     Plan: Staples removed Steri-Strips applied.  Discussed with her scar tissue mobilization also discussed with her keeping the proximal portion of the incision clean and dry.  We will see her back in a month sooner if there is any questions or concerns.  Due to her leg length discrepancy spoke with her about getting off the shelf insert for her left shoe.  She will take 81 mg aspirin for another week and will stop it as she was on no aspirin prior to surgery.  Follow-Up Instructions: Return in about 1 month (around 02/14/2018).   Orders:  No orders of the defined types were placed in this encounter.  No orders of the defined types were placed in this encounter.     Procedures: No procedures performed   Clinical Data: No additional findings.   Subjective: Chief Complaint  Patient presents with  . Right Hip - Routine Post Op    HPI Kiara Reynolds returns today status post right total hip arthroplasty.  Again she did have a displaced fracture base of the right femoral neck.  She is on aspirin 81 mg daily and has tolerated this well.  She has had no shortness of breath fevers chills.  She states overall that she feels that her range of motion and strength improving and she is doing well. Review of Systems See HPI otherwise negative  Objective: Vital Signs: There were no vitals taken for this visit.  Physical Exam  Constitutional: She is oriented to person, place, and time. She appears well-developed and well-nourished. No distress.  Pulmonary/Chest: Effort normal.  Neurological: She is alert and oriented to person, place, and time.  Skin: She is not  diaphoretic.  Psychiatric: She has a normal mood and affect.    Ortho Exam Right hip surgical incisions well approximated with staples no signs of infection.  Right calf supple nontender.  She has overall good range of motion of the hip.  She ambulates with the use of a cane with a nonantalgic gait.  Dorsiflexion plantarflexion ankle intact. Specialty Comments:  No specialty comments available.  Imaging: No results found.   PMFS History: Patient Active Problem List   Diagnosis Date Noted  . Hip fx (HCC) 01/04/2018  . Hypertension 01/04/2018  . Right hip pain 01/04/2018  . Displaced fracture of base of neck of right femur, initial encounter for closed fracture [facility])    Past Medical History:  Diagnosis Date  . Blind right eye   . Hip fx, right, closed, initial encounter (HCC)   . Hypertension    not on antihypertensive med    Family History  Problem Relation Age of Onset  . Hypertension Mother     Past Surgical History:  Procedure Laterality Date  . TOTAL HIP ARTHROPLASTY Right 01/04/2018   Procedure: TOTAL HIP ARTHROPLASTY ANTERIOR APPROACH;  Surgeon: Kathryne Hitch, MD;  Location: [facility] OR;  Service: Orthopedics;  Laterality: Right;   Social History   Occupational History  . Not on file  Tobacco Use  .  Smoking status: Never Smoker  . Smokeless tobacco: Never Used  Substance and Sexual Activity  . Alcohol use: No    Frequency: Never  . Drug use: No  . Sexual activity: Not on file

## 2018-01-23 ENCOUNTER — Telehealth (INDEPENDENT_AMBULATORY_CARE_PROVIDER_SITE_OTHER): Payer: Self-pay | Admitting: Orthopaedic Surgery

## 2018-01-23 NOTE — Telephone Encounter (Signed)
Patient called asking if she would be okay to ride in a car for 3 hours after her recent total hip surgery. CB # [phone]

## 2018-01-23 NOTE — Telephone Encounter (Signed)
See message below  Please advise

## 2018-01-23 NOTE — Telephone Encounter (Signed)
That will be fine.  Just have her pump her feet occasionally during the ride.

## 2018-01-24 NOTE — Telephone Encounter (Signed)
Called to advise.  

## 2018-01-29 ENCOUNTER — Telehealth (INDEPENDENT_AMBULATORY_CARE_PROVIDER_SITE_OTHER): Payer: Self-pay

## 2018-01-29 NOTE — Telephone Encounter (Signed)
Patient called wanting to know is it normal for her to be having right knee pain.? Stated that right knee hurts when she applies pressure.  Patient is taking Tylenol at night and using a cane. S/P Total Right Hip replacement 01/04/18.  Cb# [phone].  Please advise.  Thank You.

## 2018-01-29 NOTE — Telephone Encounter (Signed)
Patient aware this may happen unfortunately

## 2018-02-14 ENCOUNTER — Ambulatory Visit (INDEPENDENT_AMBULATORY_CARE_PROVIDER_SITE_OTHER): Payer: Medicare Other | Admitting: Physician Assistant

## 2018-02-14 ENCOUNTER — Encounter (INDEPENDENT_AMBULATORY_CARE_PROVIDER_SITE_OTHER): Payer: Self-pay | Admitting: Physician Assistant

## 2018-02-14 DIAGNOSIS — Z96641 Presence of right artificial hip joint: Secondary | ICD-10-CM

## 2018-02-14 NOTE — Progress Notes (Signed)
HPI: [name] returns today follow-up of her right hip status post total hip arthroplasty secondary to hip fracture.  She continues to have some soreness and some groin pain.  She did not help with the leg length discrepancy is using with in her shoe.  Overall she feels she is trending towards improvement.  She had no fevers chills shortness of breath.   Physical exam right hip some discomfort with extremes of internal and external rotation.  Is able to get on and off the exam table easily.  Surgical incisions well-healed.  Leg length discrepancy with right leg significantly longer than the left.  Right calf supple nontender   Impression: Status post right total hip arthroplasty 01/04/2018  Plan: She will continue to use the lift in her shoe.  I did offer to send her to [employer] for a lift she defers.  She will use an off-the-shelf lift at this point time.  We will see her back in 4 months at that time we will obtain an AP pelvis and lateral view of her right hip.  Questions are encouraged and answered at length today by Dr. Magnus IvanBlackman and myself

## 2018-06-19 ENCOUNTER — Ambulatory Visit (INDEPENDENT_AMBULATORY_CARE_PROVIDER_SITE_OTHER): Payer: Medicare Other | Admitting: Physician Assistant

## 2018-06-26 ENCOUNTER — Ambulatory Visit (INDEPENDENT_AMBULATORY_CARE_PROVIDER_SITE_OTHER): Payer: Medicare Other | Admitting: Physician Assistant

## 2018-06-26 ENCOUNTER — Encounter (INDEPENDENT_AMBULATORY_CARE_PROVIDER_SITE_OTHER): Payer: Self-pay | Admitting: Physician Assistant

## 2018-06-26 ENCOUNTER — Ambulatory Visit (INDEPENDENT_AMBULATORY_CARE_PROVIDER_SITE_OTHER): Payer: Self-pay

## 2018-06-26 DIAGNOSIS — Z96641 Presence of right artificial hip joint: Secondary | ICD-10-CM

## 2018-06-26 NOTE — Progress Notes (Signed)
Office Visit Note   Patient: Kiara Reynolds           Date of Birth: January 03, 1941           MRN: [id number] Visit Date: 06/26/2018              Requested by: No referring provider defined for this encounter. PCP: Patient, No Pcp Per   Assessment & Plan: Visit Diagnoses:  1. Status post total replacement of right hip     Plan: Have her follow-up at 1 year postop AP and lateral views of the hip at that time.  Did discuss with her having a bone density scan and she is never had one she will talk to her primary care physician about this.  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  She may benefit from a trochanteric injection if her pain becomes worse.  Questions were encouraged and answered.  Follow-Up Instructions: Return in about 6 months (around 12/27/2018) for Radiographs.   Orders:  Orders Placed This Encounter  Procedures  . XR Pelvis 1-2 Views   No orders of the defined types were placed in this encounter.     Procedures: No procedures performed   Clinical Data: No additional findings.   Subjective: Chief Complaint  Patient presents with  . Right Hip - Follow-up    HPI [name] returns today now 6 months status post right total hip arthroplasty due to hip fracture.  She is overall doing well.  She states she is getting used to the leg length discrepancy.  She is using a lift in her left shoe.  But she states even barefoot around at home or on the beach she does not notice it as much.  She does have some discomfort in the hip if she walks for prolonged period of time but this also is improving.  She does have some pain lateral aspect of the right hip at times initially if she is slept on the side.  Review of Systems Please see HPI otherwise negative  Objective: Vital Signs: There were no vitals taken for this visit.  Physical Exam General: Well-developed well-nourished female no acute distress made affect  appropriate. Ortho Exam Right hip good range of motion without pain.  Right calf supple nontender.  She ambulates with out any assistive device and a nonantalgic gait. Specialty Comments:  No specialty comments available.  Imaging: Xr Pelvis 1-2 Views  Result Date: 06/26/2018 AP pelvis lateral view of the right hip: Bilateral hips well located.  No acute fractures.  Well-seated total hip arthroplasty components without any signs of complication.    PMFS History: Patient Active Problem List   Diagnosis Date Noted  . Hip fx (HCC) 01/04/2018  . Hypertension 01/04/2018  . Right hip pain 01/04/2018  . Displaced fracture of base of neck of right femur, initial encounter for closed fracture [facility](HCC)    Past Medical History:  Diagnosis Date  . Blind right eye   . Hip fx, right, closed, initial encounter (HCC)   . Hypertension    not on antihypertensive med    Family History  Problem Relation Age of Onset  . Hypertension Mother     Past Surgical History:  Procedure Laterality Date  . TOTAL HIP ARTHROPLASTY Right 01/04/2018   Procedure: TOTAL HIP ARTHROPLASTY ANTERIOR APPROACH;  Surgeon: Kathryne HitchBlackman, Christopher Y, MD;  Location: [facility] OR;  Service: Orthopedics;  Laterality: Right;   Social History   Occupational  History  . Not on file  Tobacco Use  . Smoking status: Never Smoker  . Smokeless tobacco: Never Used  Substance and Sexual Activity  . Alcohol use: No    Frequency: Never  . Drug use: No  . Sexual activity: Not on file

## 2019-01-01 ENCOUNTER — Ambulatory Visit (INDEPENDENT_AMBULATORY_CARE_PROVIDER_SITE_OTHER): Payer: Medicare Other | Admitting: Physician Assistant

## 2019-01-20 ENCOUNTER — Ambulatory Visit (INDEPENDENT_AMBULATORY_CARE_PROVIDER_SITE_OTHER): Payer: Medicare Other | Admitting: Physician Assistant

## 2019-03-01 IMAGING — DX DG PORTABLE PELVIS
1 series · 2 of 2 positions shown · non-contrast
Comparison: 01/04/2018

CLINICAL DATA: Status post right hip replacement

EXAM:
PORTABLE PELVIS 1-2 VIEWS

[Series 1: pelvis ap · 0.14mm/px · 2 of 2 slices shown]
[im 1/2]
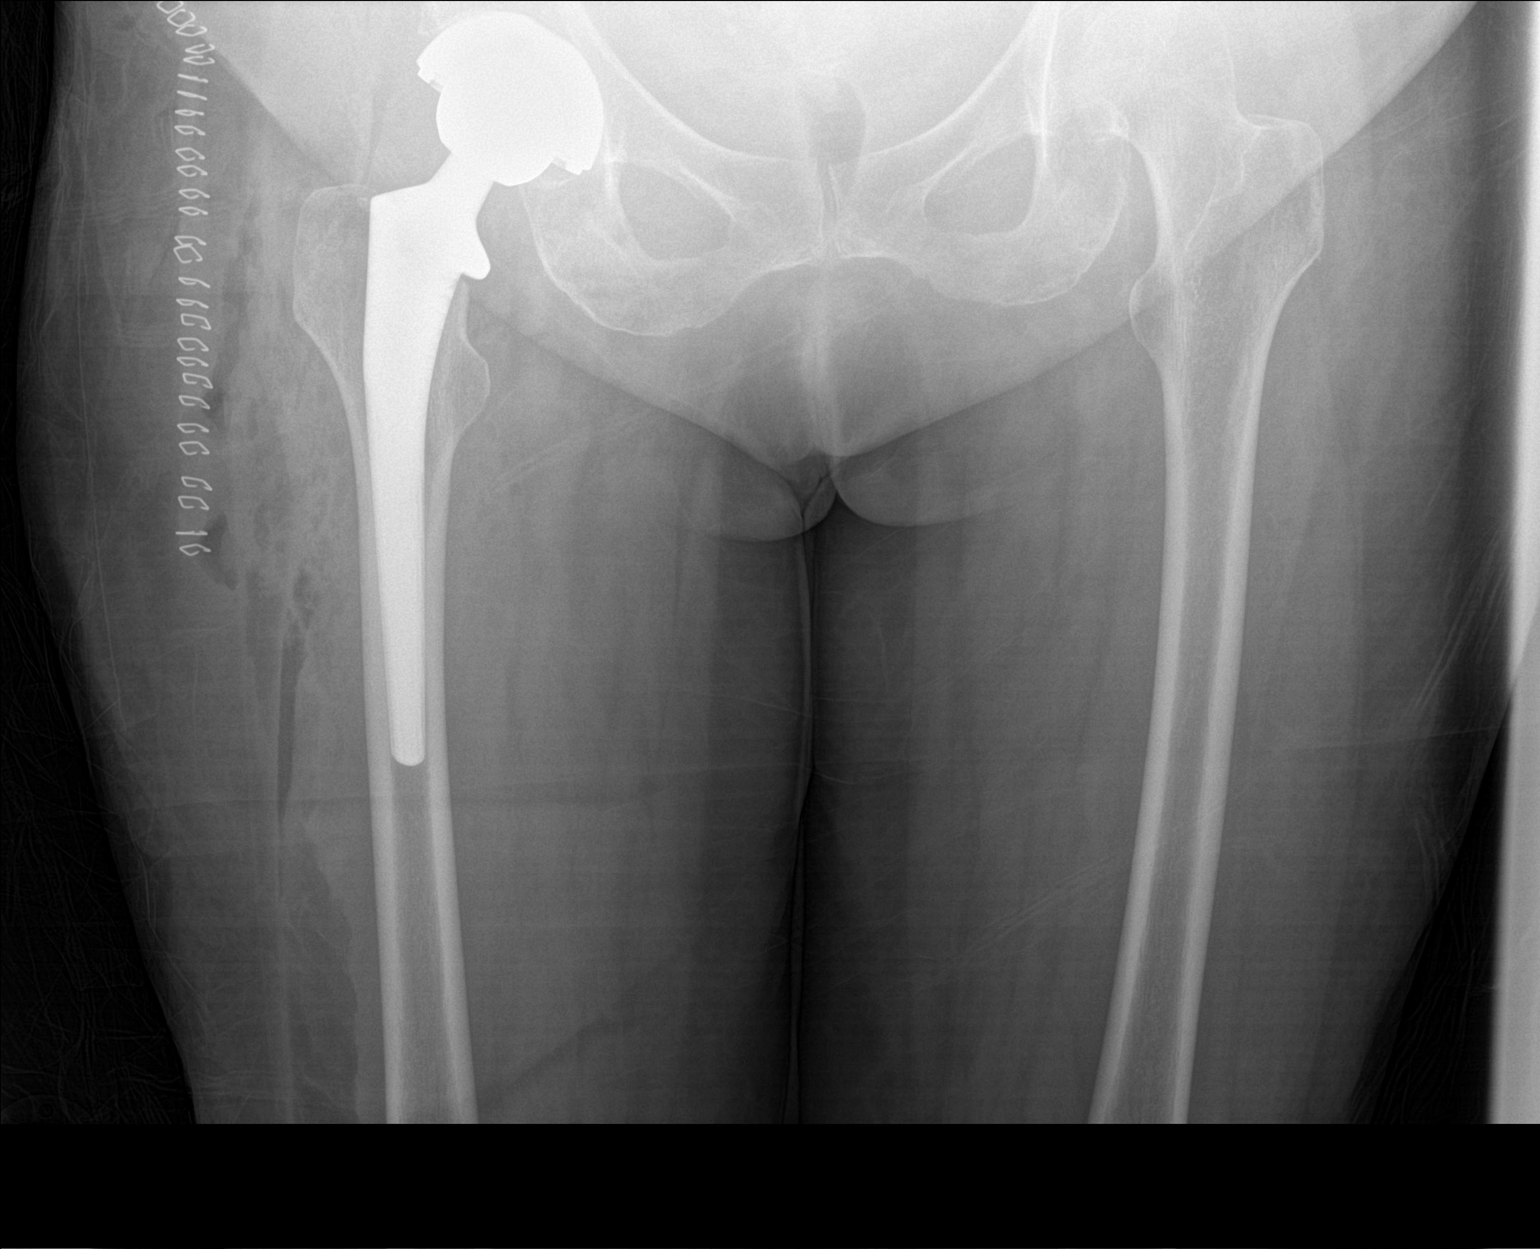
[im 2/2]
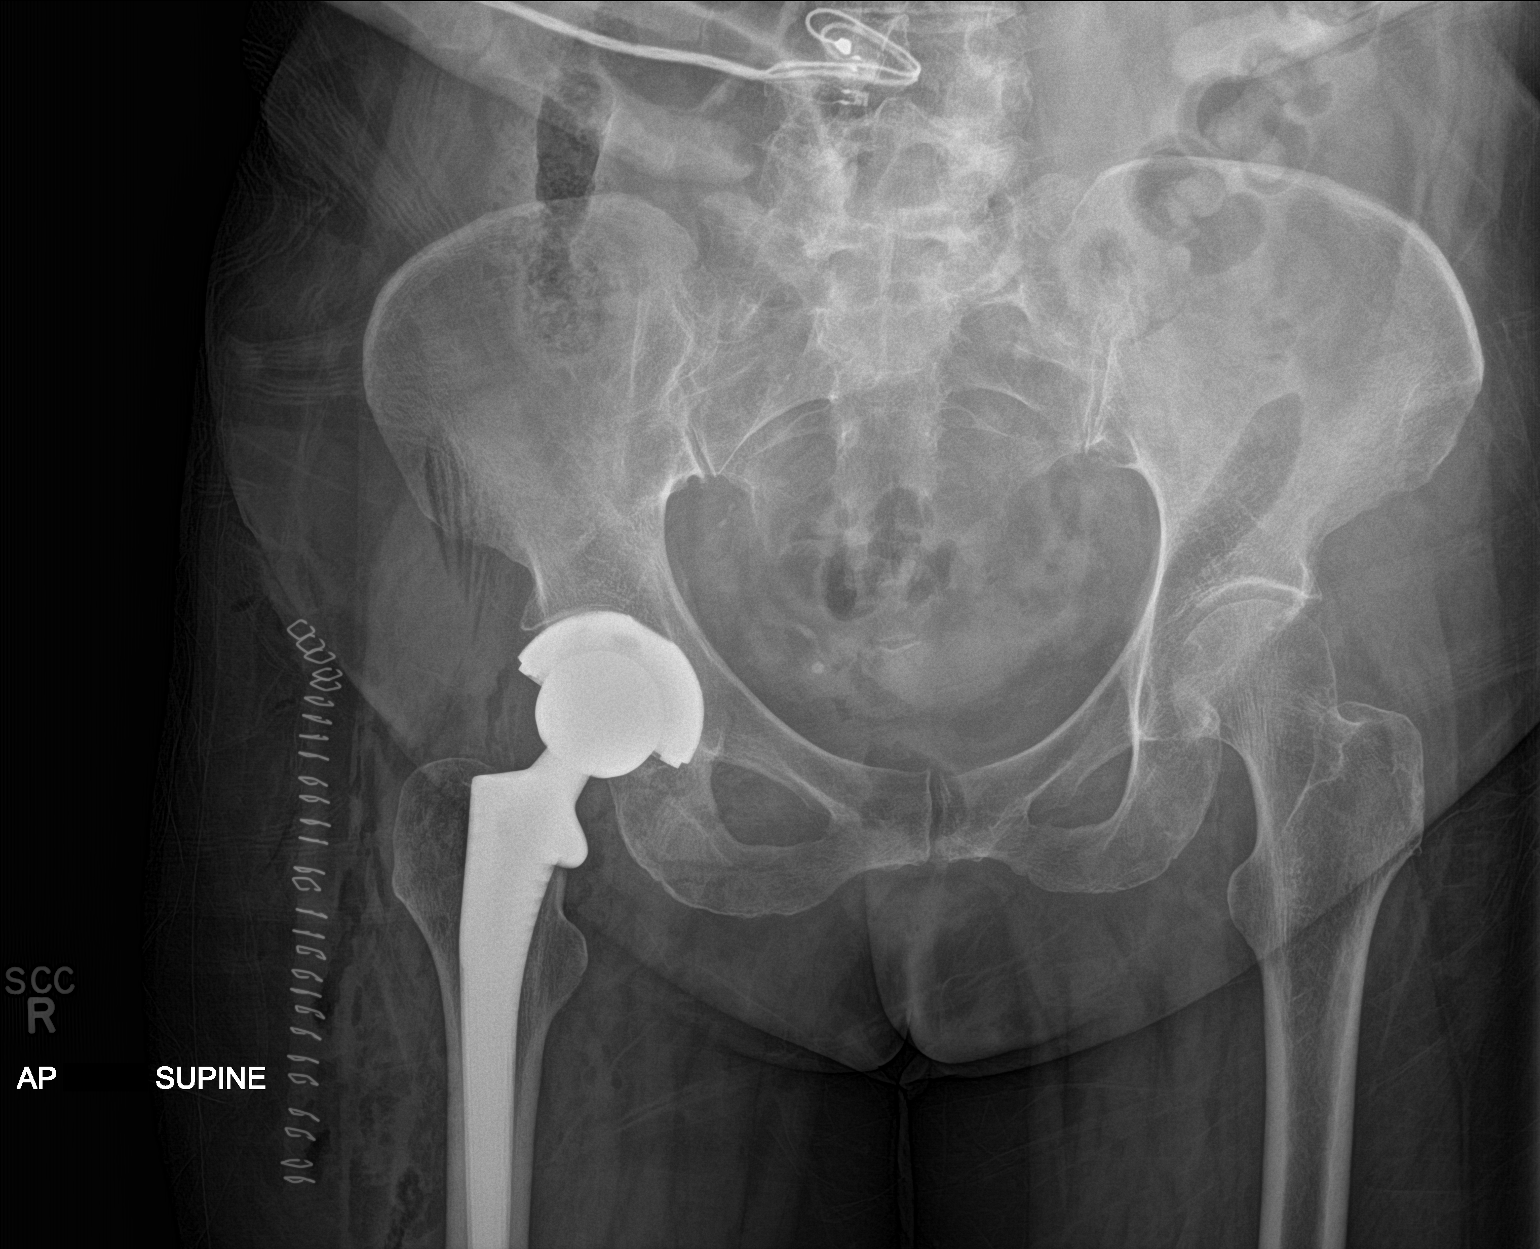

[2 of 2 positions shown; findings below may reference images not displayed]

FINDINGS: SI joints are patent. Pubic symphysis and rami are intact. Interval
right hip replacement with normal alignment. Gas in the soft tissues
of the right hip and thigh. Cutaneous staples.
IMPRESSION: 1. Interval right hip replacement with normal alignment
2. Gas in the soft tissues of the lateral hip and thigh, consistent
with recent post operative status

## 2019-03-01 IMAGING — CR DG CHEST 1V
1 series · 1 of 1 positions shown · non-contrast
Comparison: None.

CLINICAL DATA: 77 y/o  F; right hip surgery today.

EXAM:
CHEST 1 VIEW

[chest ap]
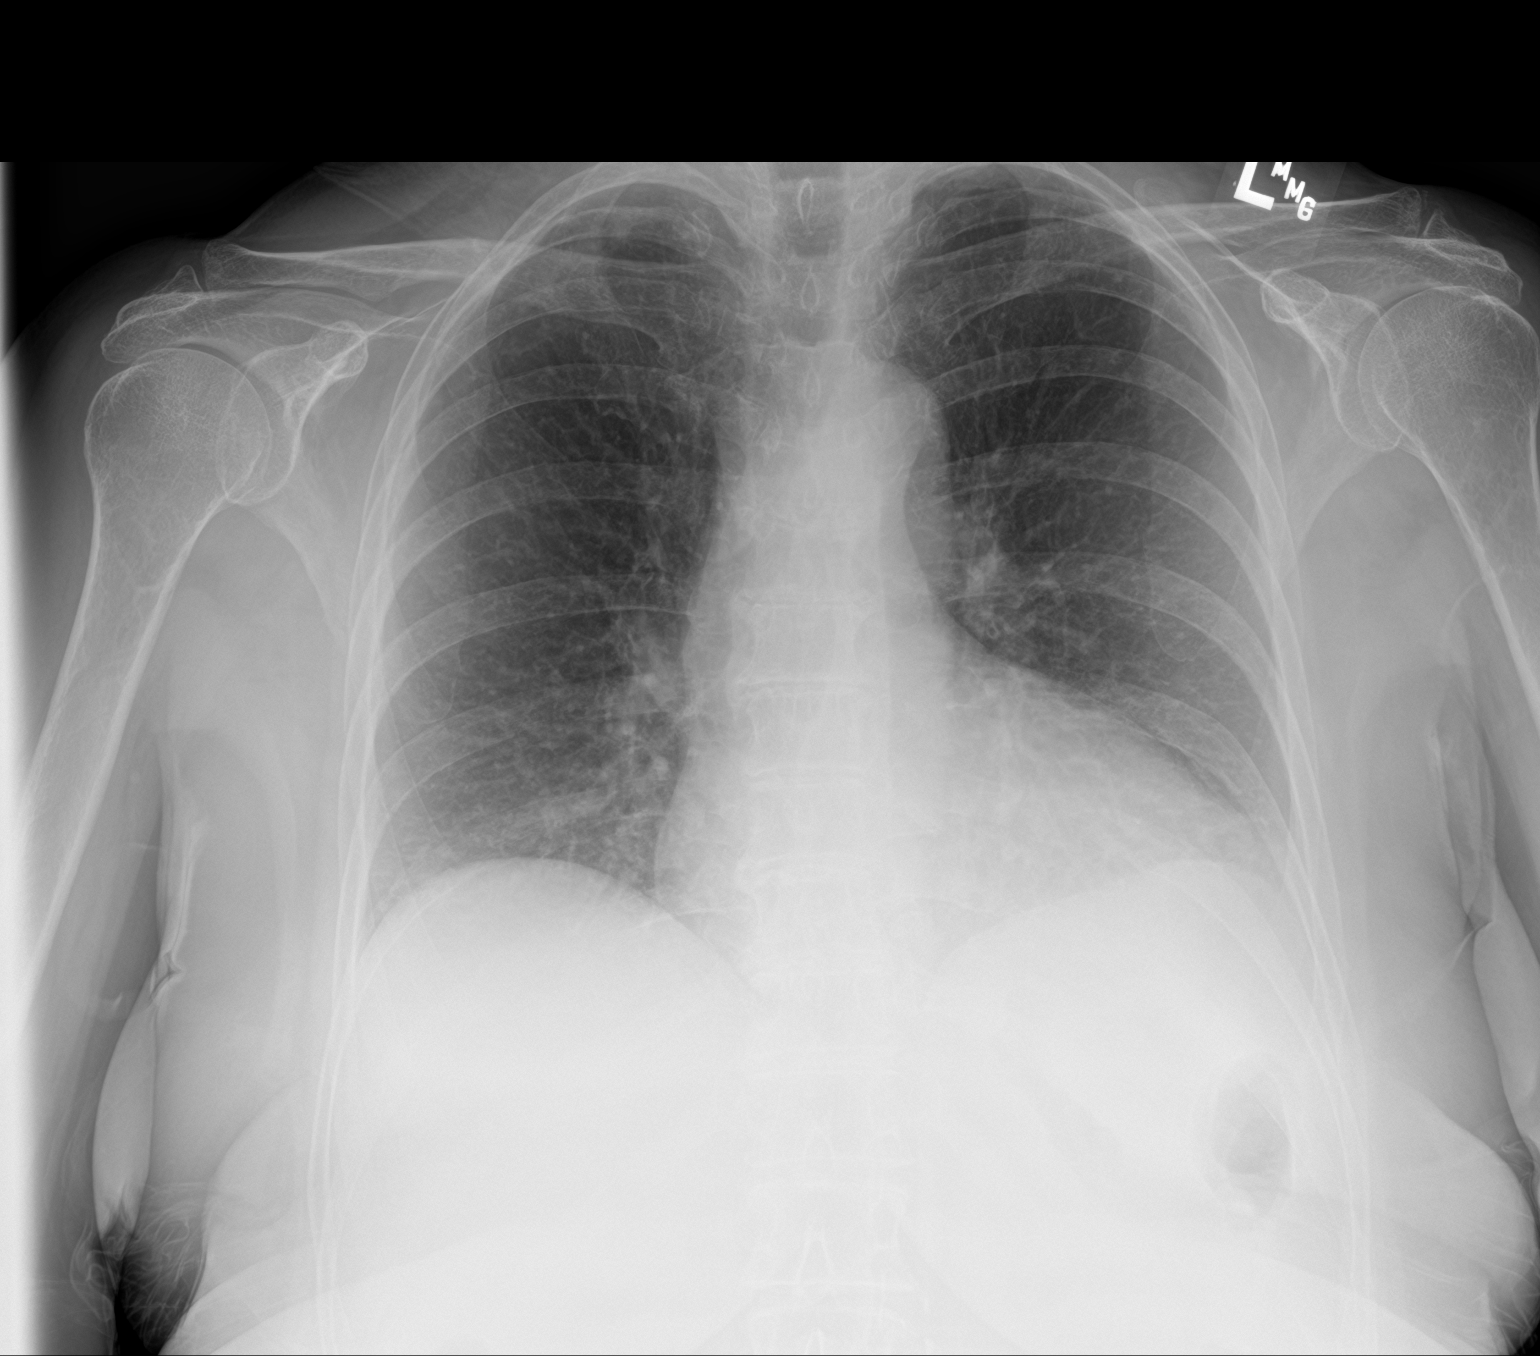

[1 of 1 positions shown; findings below may reference images not displayed]

FINDINGS: The heart size and mediastinal contours are within normal limits.
Both lungs are clear. The visualized skeletal structures are
unremarkable.
IMPRESSION: No active disease.

By: Kenichiro Tejano M.D.

## 2019-03-01 IMAGING — CR DG HIP (WITH OR WITHOUT PELVIS) 2-3V*R*
3 series · 3 of 3 positions shown · non-contrast
Comparison: None

CLINICAL DATA: Acute RIGHT hip pain following fall 10 days ago.
Initial encounter.

EXAM:
DG HIP (WITH OR WITHOUT PELVIS) 2-3V RIGHT

[pelvis ap]
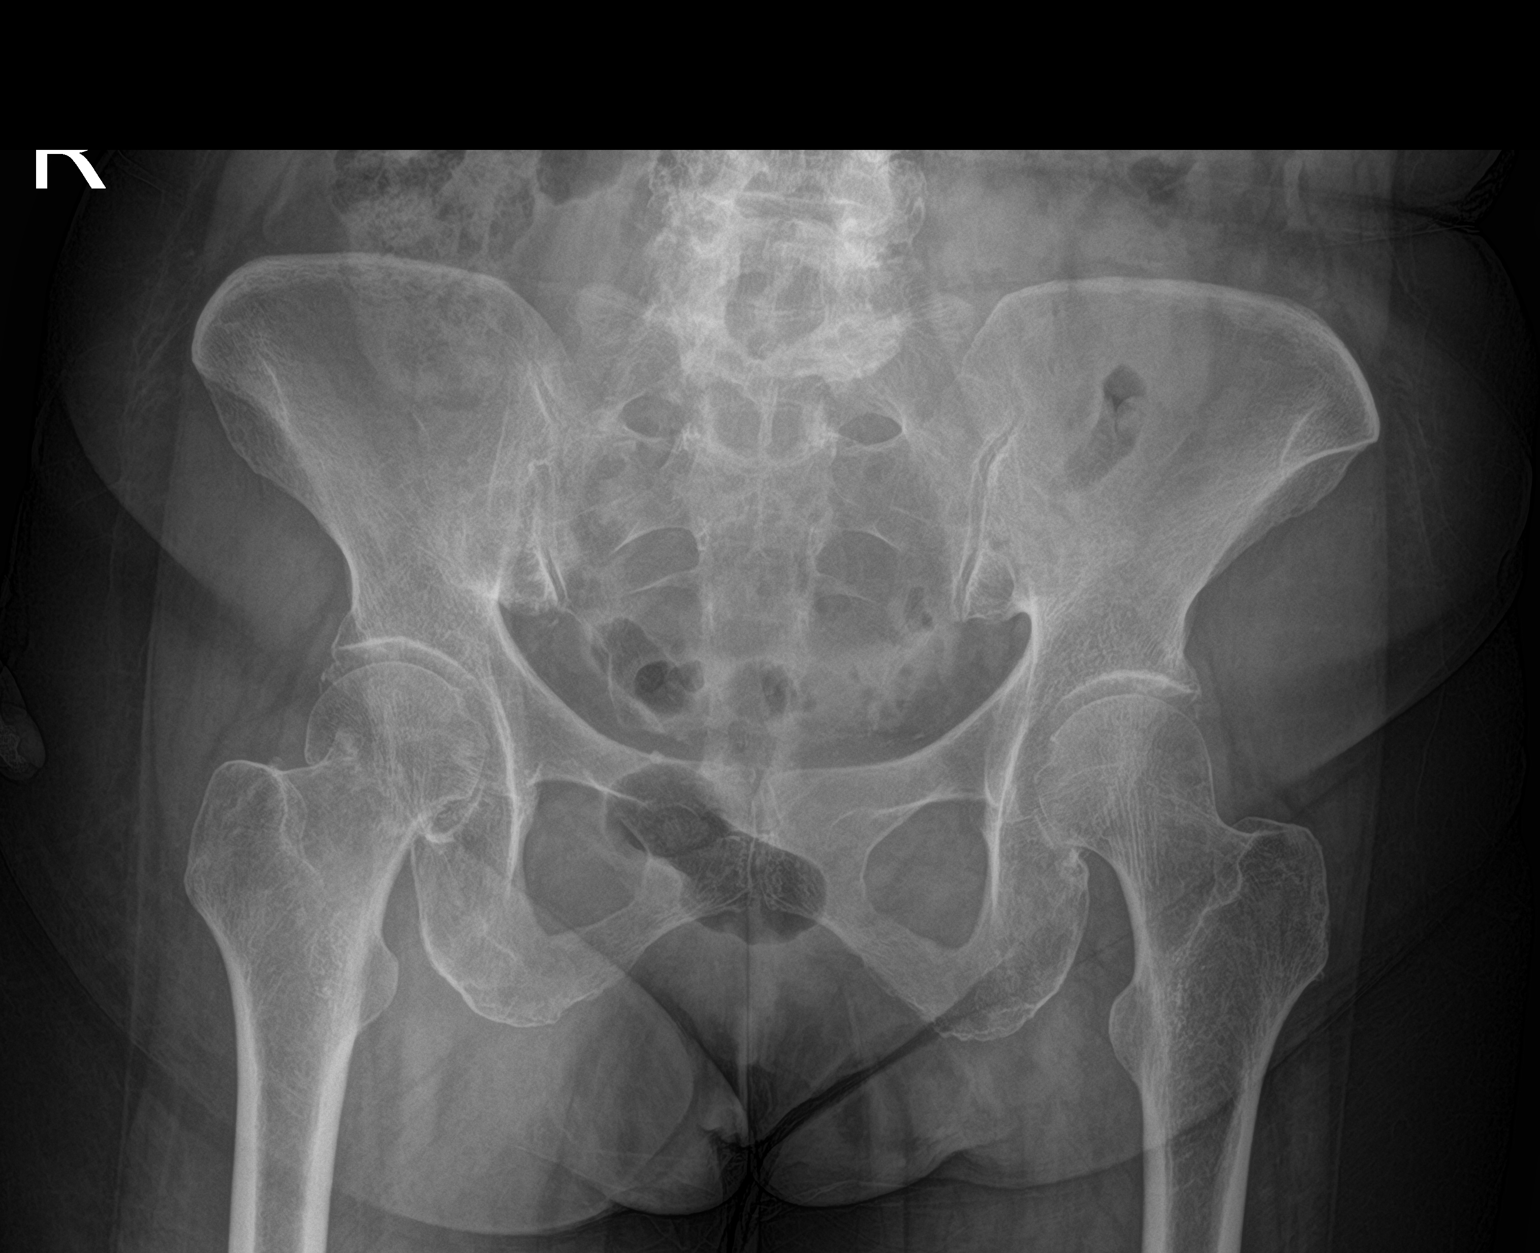

[hip ap]
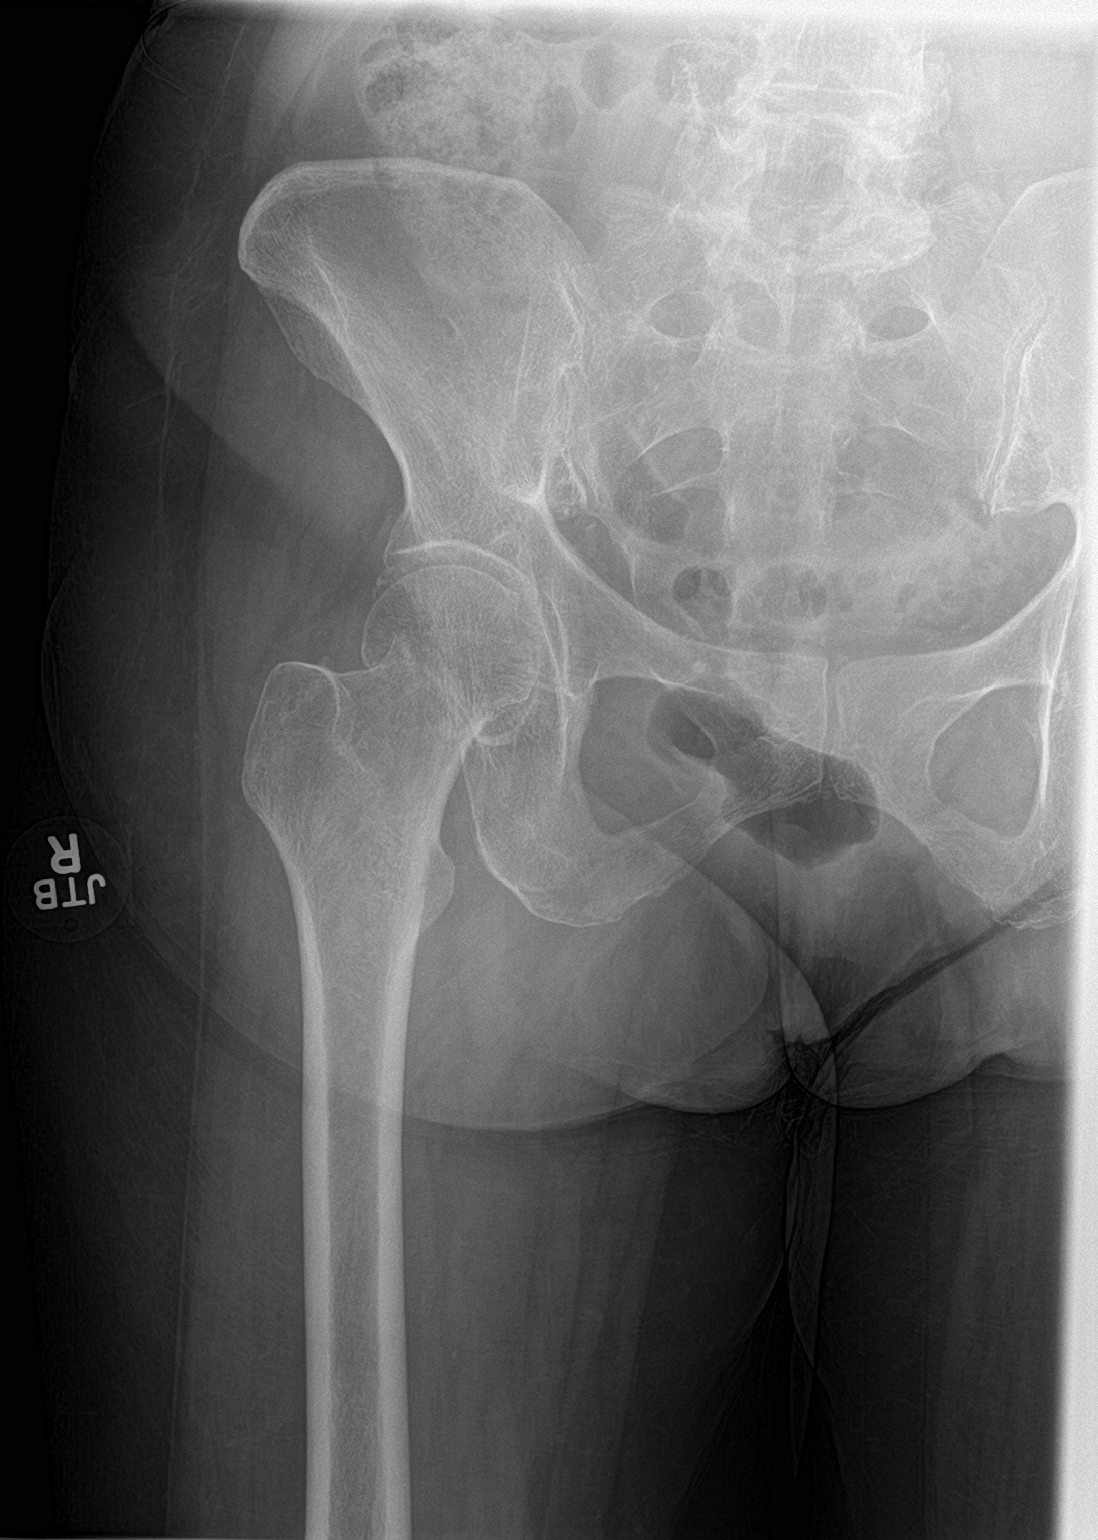

[hip lat]
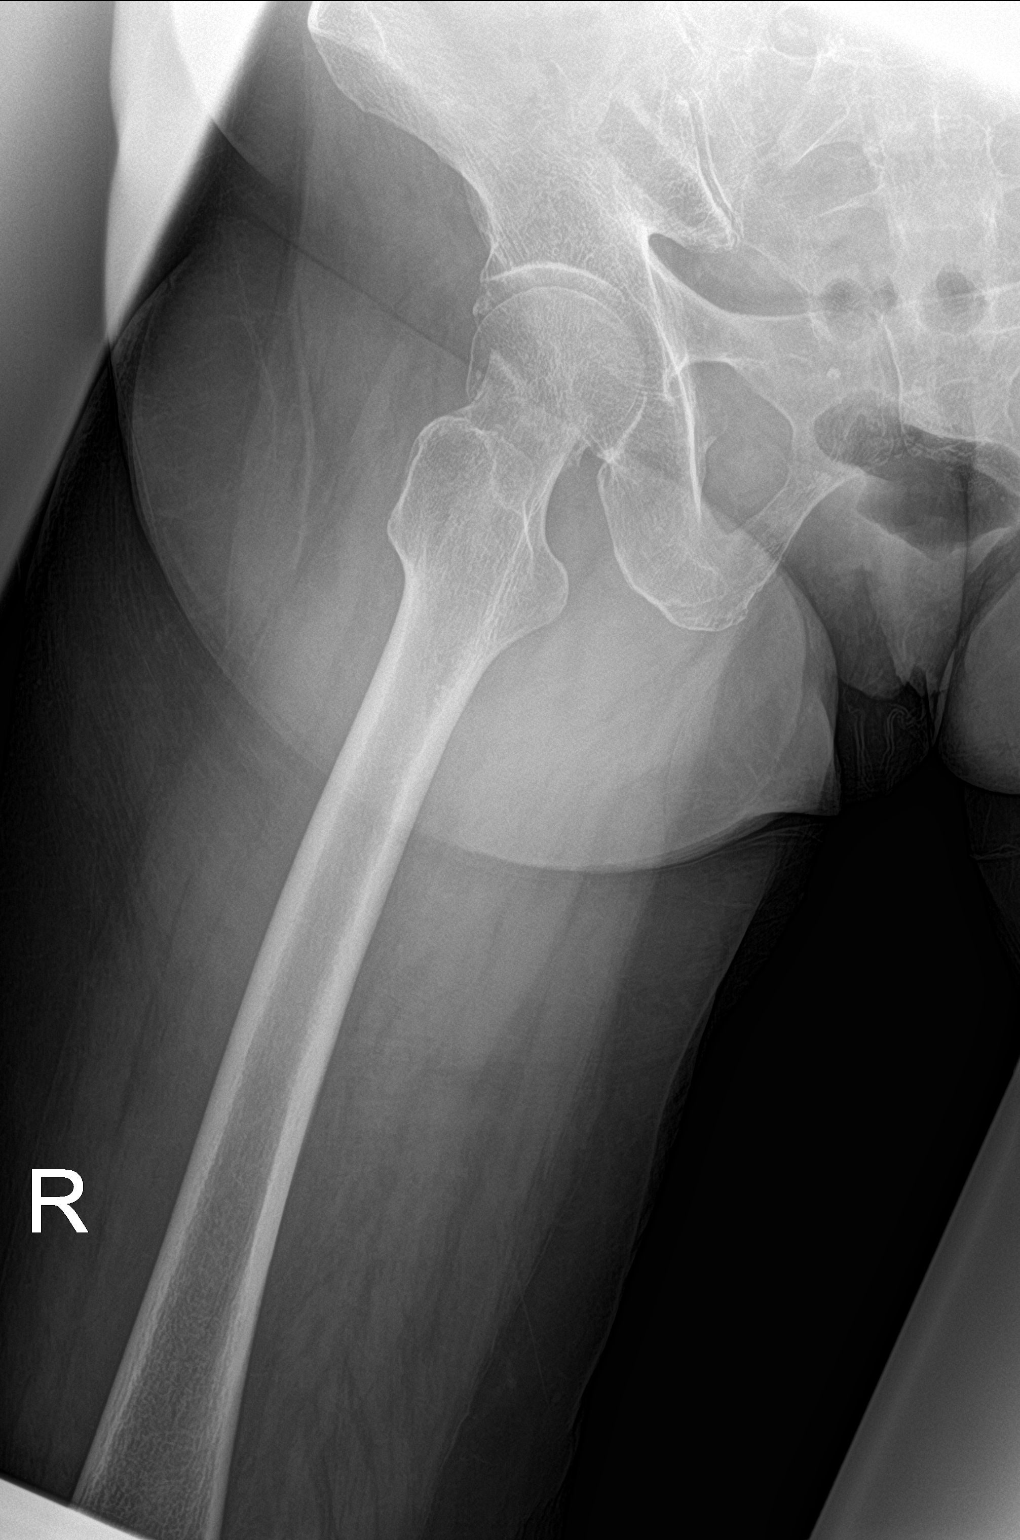

[3 of 3 positions shown; findings below may reference images not displayed]

FINDINGS: A RIGHT femoral neck fracture is noted valgus angulation.

There is no evidence of dislocation.

The visualized portions of the LEFT hip are unremarkable.

Degenerative changes in the LOWER lumbar spine are identified.
IMPRESSION: RIGHT femoral neck fracture.

## 2023-10-22 ENCOUNTER — Emergency Department (HOSPITAL_BASED_OUTPATIENT_CLINIC_OR_DEPARTMENT_OTHER): Payer: Medicare PPO

## 2023-10-22 ENCOUNTER — Emergency Department (HOSPITAL_BASED_OUTPATIENT_CLINIC_OR_DEPARTMENT_OTHER)
Admission: EM | Admit: 2023-10-22 | Discharge: 2023-10-23 | Disposition: A | Discharge status: Home / Self Care | Payer: Medicare PPO | Attending: Emergency Medicine | Admitting: Emergency Medicine

## 2023-10-22 ENCOUNTER — Other Ambulatory Visit: Payer: Self-pay

## 2023-10-22 ENCOUNTER — Emergency Department (HOSPITAL_BASED_OUTPATIENT_CLINIC_OR_DEPARTMENT_OTHER): Payer: Medicare PPO | Admitting: Radiology

## 2023-10-22 DIAGNOSIS — H539 Unspecified visual disturbance: Secondary | ICD-10-CM

## 2023-10-22 DIAGNOSIS — H53129 Transient visual loss, unspecified eye: Secondary | ICD-10-CM | POA: Insufficient documentation

## 2023-10-22 DIAGNOSIS — R42 Dizziness and giddiness: Secondary | ICD-10-CM | POA: Insufficient documentation

## 2023-10-22 DIAGNOSIS — Z7982 Long term (current) use of aspirin: Secondary | ICD-10-CM | POA: Insufficient documentation

## 2023-10-22 DIAGNOSIS — R931 Abnormal findings on diagnostic imaging of heart and coronary circulation: Secondary | ICD-10-CM | POA: Diagnosis not present

## 2023-10-22 DIAGNOSIS — R2681 Unsteadiness on feet: Secondary | ICD-10-CM | POA: Insufficient documentation

## 2023-10-22 DIAGNOSIS — R030 Elevated blood-pressure reading, without diagnosis of hypertension: Secondary | ICD-10-CM

## 2023-10-22 DIAGNOSIS — I2510 Atherosclerotic heart disease of native coronary artery without angina pectoris: Secondary | ICD-10-CM | POA: Diagnosis not present

## 2023-10-22 DIAGNOSIS — I10 Essential (primary) hypertension: Secondary | ICD-10-CM | POA: Diagnosis not present

## 2023-10-22 DIAGNOSIS — R519 Headache, unspecified: Secondary | ICD-10-CM | POA: Diagnosis not present

## 2023-10-22 DIAGNOSIS — Z1152 Encounter for screening for COVID-19: Secondary | ICD-10-CM | POA: Diagnosis not present

## 2023-10-22 DIAGNOSIS — R9389 Abnormal findings on diagnostic imaging of other specified body structures: Secondary | ICD-10-CM

## 2023-10-22 DIAGNOSIS — H9201 Otalgia, right ear: Secondary | ICD-10-CM | POA: Diagnosis present

## 2023-10-22 LAB — URINALYSIS, W/ REFLEX TO CULTURE (INFECTION SUSPECTED)
Bacteria, UA: NONE SEEN
Bilirubin Urine: NEGATIVE
Glucose, UA: NEGATIVE mg/dL
Ketones, ur: 15 mg/dL — AB
Leukocytes,Ua: NEGATIVE
Nitrite: NEGATIVE
Protein, ur: NEGATIVE mg/dL
Specific Gravity, Urine: 1.044 — ABNORMAL HIGH (ref 1.005–1.030)
pH: 6.5 (ref 5.0–8.0)

## 2023-10-22 LAB — COMPREHENSIVE METABOLIC PANEL
ALT: 12 U/L (ref 0–44)
AST: 18 U/L (ref 15–41)
Albumin: 4 g/dL (ref 3.5–5.0)
Alkaline Phosphatase: 65 U/L (ref 38–126)
Anion gap: 8 (ref 5–15)
BUN: 13 mg/dL (ref 8–23)
CO2: 26 mmol/L (ref 22–32)
Calcium: 9.5 mg/dL (ref 8.9–10.3)
Chloride: 105 mmol/L (ref 98–111)
Creatinine, Ser: 0.63 mg/dL (ref 0.44–1.00)
GFR, Estimated: >60 mL/min (ref >60)
Glucose, Bld: 100 mg/dL — ABNORMAL HIGH (ref 70–99)
Potassium: 3.8 mmol/L (ref 3.5–5.1)
Sodium: 139 mmol/L (ref 135–145)
Total Bilirubin: 0.6 mg/dL (ref <1.2)
Total Protein: 7.5 g/dL (ref 6.5–8.1)

## 2023-10-22 LAB — RESP PANEL BY RT-PCR (RSV, FLU A&B, COVID)  RVPGX2
Influenza A by PCR: NEGATIVE
Influenza B by PCR: NEGATIVE
Resp Syncytial Virus by PCR: NEGATIVE
SARS Coronavirus 2 by RT PCR: NEGATIVE

## 2023-10-22 LAB — CBC WITH DIFFERENTIAL/PLATELET
Abs Immature Granulocytes: 0.02 10*3/uL (ref 0.00–0.07)
Basophils Absolute: 0.1 10*3/uL (ref 0.0–0.1)
Basophils Relative: 1 %
Eosinophils Absolute: 0 10*3/uL (ref 0.0–0.5)
Eosinophils Relative: 0 %
HCT: 42.6 % (ref 36.0–46.0)
Hemoglobin: 14.2 g/dL (ref 12.0–15.0)
Immature Granulocytes: 0 %
Lymphocytes Relative: 18 %
Lymphs Abs: 1.2 10*3/uL (ref 0.7–4.0)
MCH: 28.6 pg (ref 26.0–34.0)
MCHC: 33.3 g/dL (ref 30.0–36.0)
MCV: 85.7 fL (ref 80.0–100.0)
Monocytes Absolute: 0.6 10*3/uL (ref 0.1–1.0)
Monocytes Relative: 8 %
Neutro Abs: 5 10*3/uL (ref 1.7–7.7)
Neutrophils Relative %: 73 %
Platelets: 227 10*3/uL (ref 150–400)
RBC: 4.97 MIL/uL (ref 3.87–5.11)
RDW: 13 % (ref 11.5–15.5)
WBC: 6.8 10*3/uL (ref 4.0–10.5)
nRBC: 0 % (ref 0.0–0.2)

## 2023-10-22 LAB — TSH: TSH: 2.09 u[IU]/mL (ref 0.350–4.500)

## 2023-10-22 LAB — LIPASE, BLOOD: Lipase: 23 U/L (ref 11–51)

## 2023-10-22 LAB — LACTIC ACID, PLASMA: Lactic Acid, Venous: 0.9 mmol/L (ref 0.5–1.9)

## 2023-10-22 MED ORDER — MECLIZINE HCL 25 MG PO TABS
25.0000 mg | ORAL_TABLET | Freq: Once | ORAL | Status: DC
  Filled 2023-10-22: qty 1

## 2023-10-22 MED ORDER — IOHEXOL 350 MG/ML SOLN
100.0000 mL | Freq: Once | INTRAVENOUS | Status: AC | PRN
  Administered 2023-10-22: 50 mL via INTRAVENOUS

## 2023-10-22 NOTE — ED Triage Notes (Signed)
[facility] fro MRI

## 2023-10-22 NOTE — ED Provider Notes (Signed)
[place] EMERGENCY DEPARTMENT AT [facility] Provider Note   CSN: [id number] Arrival date & time: 10/22/23  1449     History  Chief Complaint  Patient presents with   Otalgia    Kiara Reynolds is a 82 y.o. female.  The history is provided by the patient and medical records. No language interpreter was used.  Otalgia Location:  Right Behind ear:  No abnormality Quality:  Aching Severity:  Mild Onset quality:  Unable to specify Timing:  Constant Progression:  Waxing and waning Chronicity:  New Context: not direct blow   Relieved by:  Nothing Worsened by:  Nothing Ineffective treatments:  None tried Associated symptoms: congestion, headaches, rhinorrhea and vomiting   Associated symptoms: no abdominal pain, no cough, no diarrhea, no ear discharge, no fever, no neck pain, no rash and no sore throat   Neurologic Problem This is a new problem. The current episode started more than 2 days ago. The problem occurs constantly. The problem has been gradually improving. Associated symptoms include headaches. Pertinent negatives include no chest pain, no abdominal pain and no shortness of breath. Nothing aggravates the symptoms. Nothing relieves the symptoms. She has tried nothing for the symptoms. The treatment provided no relief.       Home Medications Prior to Admission medications   Medication Sig Start Date End Date Taking? Authorizing Provider  aspirin 81 MG chewable tablet Chew 1 tablet (81 mg total) by mouth 2 (two) times daily after a meal. Patient not taking: Reported on 06/26/2018 01/06/18   Kathryne Hitch, MD  dorzolamide-timolol (COSOPT) 22.3-6.8 MG/ML ophthalmic solution Place 1 drop into both eyes 2 (two) times daily. 10/26/17   [provider]  latanoprost (XALATAN) 0.005 % ophthalmic solution Place 1 drop into both eyes at bedtime. 12/11/17   [provider]  methocarbamol (ROBAXIN) 500 MG tablet Take 1 tablet (500 mg total) by  mouth every 6 (six) hours as needed for muscle spasms. Patient not taking: Reported on 06/26/2018 01/06/18   Kirtland Bouchard, PA-C      Allergies    Penicillins, Aspirin, Atropine, and Cyclopentolate    Review of Systems   Review of Systems  Constitutional:  Negative for chills, diaphoresis, fatigue and fever.  HENT:  Positive for congestion, ear pain, rhinorrhea, sinus pressure and sinus pain. Negative for ear discharge, facial swelling, sore throat and trouble swallowing.   Eyes:  Positive for visual disturbance (transient).  Respiratory:  Negative for cough, chest tightness, shortness of breath and wheezing.   Cardiovascular:  Negative for chest pain and palpitations.  Gastrointestinal:  Positive for nausea and vomiting. Negative for abdominal pain, constipation and diarrhea.  Genitourinary:  Negative for dysuria.  Musculoskeletal:  Negative for back pain, neck pain and neck stiffness.  Skin:  Negative for rash and wound.  Neurological:  Positive for headaches. Negative for seizures, facial asymmetry, speech difficulty, weakness, light-headedness and numbness.  Psychiatric/Behavioral:  Negative for agitation.   All other systems reviewed and are negative.   Physical Exam Updated Vital Signs There were no vitals taken for this visit. Physical Exam Vitals and nursing note reviewed.  Constitutional:      General: She is not in acute distress.    Appearance: She is well-developed. She is not ill-appearing, toxic-appearing or diaphoretic.  HENT:     Head: Normocephalic and atraumatic.     Nose: No congestion or rhinorrhea.     Mouth/Throat:     Mouth: Mucous membranes are moist.  Pharynx: No oropharyngeal exudate or posterior oropharyngeal erythema.  Eyes:     Conjunctiva/sclera: Conjunctivae normal.  Neck:     Vascular: No carotid bruit.  Cardiovascular:     Rate and Rhythm: Normal rate and regular rhythm.     Heart sounds: No murmur heard. Pulmonary:     Effort:  Pulmonary effort is normal. No respiratory distress.     Breath sounds: Normal breath sounds. No wheezing, rhonchi or rales.  Chest:     Chest wall: No tenderness.  Abdominal:     General: Abdomen is flat.     Palpations: Abdomen is soft.     Tenderness: There is no abdominal tenderness. There is no right CVA tenderness, left CVA tenderness, guarding or rebound.  Musculoskeletal:        General: No swelling or tenderness.     Cervical back: Neck supple. No tenderness.     Right lower leg: No edema.     Left lower leg: No edema.  Skin:    General: Skin is warm and dry.     Capillary Refill: Capillary refill takes less than 2 seconds.     Findings: No erythema or rash.  Neurological:     Mental Status: She is alert.     Sensory: No sensory deficit.     Motor: No weakness.  Psychiatric:        Mood and Affect: Mood normal.     ED Results / Procedures / Treatments   Labs (all labs ordered are listed, but only abnormal results are displayed) Labs Reviewed  COMPREHENSIVE METABOLIC PANEL - Abnormal; Notable for the following components:      Result Value   Glucose, Bld 100 (*)    All other components within normal limits  RESP PANEL BY RT-PCR (RSV, FLU A&B, COVID)  RVPGX2  CBC WITH DIFFERENTIAL/PLATELET  LACTIC ACID, PLASMA  TSH  LIPASE, BLOOD  LACTIC ACID, PLASMA  URINALYSIS, W/ REFLEX TO CULTURE (INFECTION SUSPECTED)    EKG EKG Interpretation Date/Time:  Monday October 22 2023 15:47:46 EST Ventricular Rate:  72 PR Interval:  182 QRS Duration:  138 QT Interval:  448 QTC Calculation: 491 R Axis:   -81  Text Interpretation: Sinus rhythm Nonspecific IVCD with LAD Left ventricular hypertrophy Anterior infarct, old no prior ECG for comparison. no STEMI Confirmed by Theda Belfast ([id number]) on 10/22/2023 3:52:33 PM  Radiology CT ANGIO HEAD NECK W WO CM Result Date: 10/22/2023 CLINICAL DATA:  Neuro deficit, acute, stroke suspected Neurology recommended CTA head and neck  and CT head without. Transient vision change, headache, persistent unsteadiness for the last few days. EXAM: CT ANGIOGRAPHY HEAD AND NECK WITH AND WITHOUT CONTRAST TECHNIQUE: Multidetector CT imaging of the head and neck was performed using the standard protocol during bolus administration of intravenous contrast. Multiplanar CT image reconstructions and MIPs were obtained to evaluate the vascular anatomy. Carotid stenosis measurements (when applicable) are obtained utilizing NASCET criteria, using the distal internal carotid diameter as the denominator. RADIATION DOSE REDUCTION: This exam was performed according to the departmental dose-optimization program which includes automated exposure control, adjustment of the mA and/or kV according to patient size and/or use of iterative reconstruction technique. CONTRAST:  50mL OMNIPAQUE IOHEXOL 350 MG/ML SOLN COMPARISON:  None Available. FINDINGS: CT HEAD FINDINGS Brain: No hemorrhage. No hydrocephalus. No extra-axial fluid collection. No CT evidence of an acute cortical infarct. No mastoid. No mass lesion. Vascular: No hyperdense vessel or unexpected calcification. Skull: Normal. Negative for fracture or focal lesion. Sinuses/Orbits:  No middle ear or mastoid effusion. Paranasal sinuses are clear. Right lens replacement. Orbits are otherwise unremarkable. Other: None. Review of the MIP images confirms the above findings CTA NECK FINDINGS Aortic arch: Standard branching. Imaged portion shows no evidence of aneurysm or dissection. No significant stenosis of the major arch vessel origins. Right carotid system: No evidence of dissection, stenosis (50% or greater), or occlusion. Beaded and irregular appearance of the mid segment of the right cervical ICA, compatible with fibromuscular dysplasia. Left carotid system: No evidence of dissection or occlusion. Approximately 50% narrowing of the origin of the left ICA secondary to calcified atherosclerotic plaque. Moderate narrowing  in the supraclinoid ICA on the left (series 13, 100) Vertebral arteries: Codominant. No evidence of dissection, stenosis (50% or greater), or occlusion. Skeleton: Negative. Other neck: Negative. Upper chest: Biapical pleural-parenchymal scarring. Review of the MIP images confirms the above findings CTA HEAD FINDINGS Anterior circulation: Moderate to severe focal narrowing in the proximal right M1 segment (series 11, image 91). No aneurysm. No vascular malformation. The left MCA territory is normal in appearance. Posterior circulation: No significant stenosis, proximal occlusion, aneurysm, or vascular malformation. Venous sinuses: As permitted by contrast timing, patent. Anatomic variants: None Review of the MIP images confirms the above findings IMPRESSION: 1. No hemorrhage or CT evidence of an acute cortical infarct. 2. Moderate to severe focal narrowing in the proximal right M1 segment. 3. Beaded and irregular appearance of the mid segment of the right cervical ICA, compatible with fibromuscular dysplasia. 4. Approximately 50% narrowing at the origin of the left ICA secondary to calcified atherosclerotic plaque. Moderate narrowing in the supraclinoid ICA on the left. Electronically Signed   By: Lorenza Cambridge M.D.   On: 10/22/2023 19:08   DG Chest 2 View Result Date: 10/22/2023 CLINICAL DATA:  Congestion.  Dizziness. EXAM: CHEST - 2 VIEW COMPARISON:  Chest radiograph dated January 04, 2018. FINDINGS: The heart size and mediastinal contours are within normal limits. No focal consolidation, pleural effusion, or pneumothorax. No acute osseous abnormality. IMPRESSION: No acute cardiopulmonary findings. Electronically Signed   By: Hart Robinsons M.D.   On: 10/22/2023 18:36    Procedures Procedures    Medications Ordered in ED Medications  iohexol (OMNIPAQUE) 350 MG/ML injection 100 mL (50 mLs Intravenous Contrast Given 10/22/23 1715)    ED Course/ Medical Decision Making/ A&P                                  Medical Decision Making Amount and/or Complexity of Data Reviewed Labs: ordered. Radiology: ordered.  Risk Prescription drug management.    Dayany Tio is a 82 y.o. 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  According to patient, ever since having a knee replacement 5 years ago, she has had some difficulty with ambulation due to a leg length difference that developed but she reports she has had minimal issues until Friday and Saturday when she has had near inability to safely ambulate.  She says she does not feel like she is spinning or on a boat but feels like her balance is off.  She denies any focal numbness weakness or tingling  in extremities and denies any lightheadedness or feeling like she might pass out.  Denies any chest pain, shortness breath, or palpitations.  She does report some congestion and sinus pressure but denies productive cough.  Reports some nausea and vomiting on Saturday when it was worse but denies constipation, diarrhea, or urinary changes.  Denies leg pain or leg swelling and denies new edema.  Reports she does not take any medicines other than her for her eye and normally does not have high blood pressure.  On arrival, blood pressure in the 180s.  Denies significant neck pain or neck stiffness but does report some headache primarily in her forehead and near her right head.  Denies falls or trauma.  Denies any speech difficulties.  She reports on Saturday her vision briefly altered and felt like her vision was shifting inappropriately.  She reports chronic blurriness that she is having repaired next month with a cataract  surgery with [facility].  She went to see her doctor on Friday for surgical clearance and they said everything was fine.  On exam, lungs clear.  Chest nontender.  No murmur.  Abdomen nontender.  Intact sensation and strength in extremities.  Normal finger-nose-finger testing bilaterally.  Symmetric smile.  Clear speech.  Left pupil is reactive with normal extract movements.  Right pupil did not respond to light and did not want to go to the right.  Patient reports her vision is unchanged at this moment to her baseline.  Did not have neck stiffness and did not have any focal tenderness on head exam.  Ears did not show otitis media or otitis externa.  Did not appreciate a carotid bruit.  Given this patient was 82 years old and reports he is never had any head imaging who now has a unsteadiness and gait problem with transient vision changes and headache, I do feel she needs head imaging.  I spoke to neurology with Dr. Thad Ranger who recommended CTA head and neck, noncontrasted head CT, and MRI brain without contrast.  She agreed with letting her be hypertensive until we know there is no stroke and agreed with looking for other electrolyte problems or occult infection.  Patient agrees with plan, anticipate reassessment for workup.  After CTs, anticipate ED to ED transfer for MRI tonight.  7:48 PM Workup continues to complete.  Labs have been reassuring thus far with negative COVID/flu/RSV test, CBC and CMP reassuring, lipase normal, lactic acid normal, and TSH not elevated.  Chest x-ray did not show pneumonia CT of the head neck did show some stenosis in her neck and neurovasculature but no large stroke seen.  No bleeding.  Per recommendations, will transfer for MRI brain to look for subtle stroke and reassessment.  Patient will transfer to [facility] emergency department to get MRI tonight to rule out acute stroke given the ambulation and walking change over the last few days.    Due to her abnormal CTA and  elevated blood pressures, anticipate discussion with neurology to help determine disposition after MRI is completed.  Spoke to Dr. Alvira Monday who accepts the patient for ED to ED transfer to [facility] to get the MRI.            Final Clinical Impression(s) / ED Diagnoses Final diagnoses:  Unsteadiness  Dizziness  Acute nonintractable headache, unspecified headache type  Elevated blood pressure reading  Transient vision disturbance  Abnormal computed tomography angiography (CTA)     Clinical Impression: 1. Unsteadiness   2. Dizziness  3. Acute nonintractable headache, unspecified headache type   4. Elevated blood pressure reading   5. Transient vision disturbance   6. Abnormal computed tomography angiography (CTA)     Disposition: [facility] ED [facility] to [facility] to get MRI of the brain per neuro.   This note was prepared with assistance of Conservation officer, historic buildings. Occasional wrong-word or sound-a-like substitutions may have occurred due to the inherent limitations of voice recognition software.      Sims Laday, Canary Brim, MD 10/22/23 2000

## 2023-10-22 NOTE — ED Provider Notes (Signed)
10:39 PM Care assumed following patient transfer from [facility] ED. Sent for MRI to exclude acute CVA as cause of dizziness; namely unsteadiness with gait problem, transient vision change, headaches, and some congestion.   Prior CTA noting some moderate to severe narrowing in the proximal right M1 segment, 50% narrowing at the origin of the left ICA secondary to plaques.  Doesn't appear the patient has yet been medicated to assess for any improvement with this. Will give dose of Meclizine given reported congestion symptoms with dizziness. BP management has been held given inability to exclude embolic cause of symptoms.  11:25 PM Order placed for vitals; no repeat BP documented in the past 8 hours.  Dr. Wilford Corner of neurology has viewed the patient's CTA where proximal right M1 narrowing is noted, but does not feel this is contributing to patient's complaints. Is generally reassured by existing imaging given patient's age/comorbidities. Requests to be informed when MRI completed.   1:14 AM MRI completed. Pending formal interpretation. BP improving spontaneously w/o intervention. Most recent reading 147/68.  2:27 AM MRI negative for acute process. Pending review by Neurology with added recommendations.  2:50 AM Neurology has reviewed the imaging; state no further inpatient neurological evaluation needed. Plan for ambulation trial to assess for safe discharge.  4:26 AM Patient ambulatory with two person assist; does have a walker at home to use. Is comfortable with plan for outpatient follow up. Will provide neurologic referral should symptoms remain ongoing. Return precautions discussed and provided. Patient discharged in stable condition with no unaddressed concerns.   Antony Madura, PA-C 10/23/23 4010    Glynn Octave, MD 10/23/23 [phone]

## 2023-10-22 NOTE — ED Notes (Signed)
-  Called [facility] at 757pm for transportation to [facility] ED. Dr. Dalene Seltzer accepting.

## 2023-10-22 NOTE — ED Triage Notes (Signed)
Sinus and right ear pain x a few days Feeling "unsteady and off balance" Worse on Saturday, balance some better. Some nausea, Denies fall or head trauma or confusion

## 2023-10-23 ENCOUNTER — Emergency Department (HOSPITAL_COMMUNITY): Payer: Medicare PPO

## 2023-10-23 NOTE — Discharge Instructions (Addendum)
Your work up in the emergency department was reassuring.  We recommend follow-up with your primary care doctor for reevaluation of your symptoms as well as recheck of your blood pressure.  We have provided referral to neurology.  Neurological evaluation would be beneficial if your symptoms remain persistent.  It is possible that a degree of sinusitis/sinus infection may be contributing to your congestion, headache, and balance issues.  You may try over-the-counter Zyrtec or Claritin to see if this helps relieve your congestion.  Return to the ED for new or concerning symptoms.

## 2023-10-23 NOTE — ED Notes (Signed)
Pt able to ambulate with assistance x 2.

## 2023-10-30 ENCOUNTER — Ambulatory Visit: Payer: Medicare Other | Admitting: Neurology

## 2023-10-30 ENCOUNTER — Encounter: Payer: Self-pay | Admitting: Neurology

## 2023-10-30 VITALS — BP 177/96 | HR 83 | Ht 61.0 in | Wt 149.5 lb

## 2023-10-30 DIAGNOSIS — R2689 Other abnormalities of gait and mobility: Secondary | ICD-10-CM

## 2023-10-30 DIAGNOSIS — I669 Occlusion and stenosis of unspecified cerebral artery: Secondary | ICD-10-CM

## 2023-10-30 DIAGNOSIS — H269 Unspecified cataract: Secondary | ICD-10-CM

## 2023-10-30 DIAGNOSIS — R269 Unspecified abnormalities of gait and mobility: Secondary | ICD-10-CM

## 2023-10-30 NOTE — Progress Notes (Signed)
 [facility]  PATIENT: Kiara Reynolds DOB: 1941-04-02  REFERRING DOCTOR OR PCP:  Burnard Light, PA-C SOURCE: Patient, notes from emergency room, imaging and lab reports, imaging studies personally reviewed.  _________________________________   HISTORICAL  CHIEF COMPLAINT:  Chief Complaint  Patient presents with   Room 10    Pt is here with her Daughter. Pt's daughter states that pt was off balanced and had some vomiting on Saturday the 21st. Pt states that since then everything has been fine.     HISTORY OF PRESENT ILLNESS:  I had the pleasure seeing patient, Kiara Reynolds, at [facility] for neurologic consultation regarding her off-balance gait and abnormal imaging studies and for presurgical clearance  Ms. Stenerson is an 82 year old woman who has had difficulty gait.  Surgery seen and needs clearance.    On 10/20/2023 at night while trying to get sheets on the bed her gait became unsteady.  She notes that that day and the preceding day had been busier than typical days for her.  She continued to be off balanced 10/21/2023.  She denies vertigo, change in strength or sensation.  She stumbled but did not fall.  Her balance was poor though she was able to walk.  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  MRI of the brain showed no acute findings..  She does note that her gait was not perfect before symptoms worsened recently.  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   She does not wear a lift but generally feels her gait has been off since that time with mildly reduced balance.   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    She is doing better but gait is still not quite back to baseline.  MRI brain normal brain volume for age.  Mild chronic microvascular ischemic change.     She notes that she did not tolerate aspirin  well in her 31s but is not sure of the dose and has not taken any recently.  Despite being age 51, she rarely has seen her primary care practice in the last 5 years.  I was unable to find a lipid panel.  Imaging:  CT angiogram of the head and neck 10/22/2023 showed some narrowing of the right M1 segment.  Additional mild ICA stenosis in the supraclinoid segment bilaterally  MRI of the brain 10/22/2023 showed no acute findings.  There are white matter foci in the pons and hemispheres consistent with mild to moderate chronic microvascular ischemic change.  Brain volume was normal for age  REVIEW OF SYSTEMS: Constitutional: No fevers, chills, sweats, or change in appetite Eyes: No visual changes, double vision, eye pain Ear, nose and throat: No hearing loss, ear pain, nasal congestion, sore throat Cardiovascular: No chest pain, palpitations Respiratory:  No shortness of breath at rest or with exertion.   No wheezes GastrointestinaI: No nausea, vomiting, diarrhea, abdominal pain, fecal incontinence Genitourinary:  No dysuria, urinary retention or frequency.  No nocturia. Musculoskeletal:  No neck pain, back pain Integumentary: No rash, pruritus, skin lesions Neurological: as  above Psychiatric: No depression at this time.  No anxiety Endocrine: No palpitations, diaphoresis, change in appetite, change in weigh or increased thirst Hematologic/Lymphatic:  No anemia, purpura, petechiae. Allergic/Immunologic: No itchy/runny eyes, nasal congestion, recent allergic reactions, rashes  ALLERGIES: Allergies  Allergen Reactions   Penicillins    Aspirin  Other (See Comments)   Atropine Other (See Comments)   Cyclopentolate Other (See  Comments)    Eye red, matted and burned    HOME MEDICATIONS:  Current Outpatient Medications:    dorzolamide-timolol (COSOPT) 22.3-6.8 MG/ML ophthalmic solution, Place 1 drop into both eyes 2 (two) times daily., Disp: , Rfl: 0   latanoprost (XALATAN) 0.005 % ophthalmic solution, Place 1 drop into both eyes at bedtime., Disp: , Rfl:    aspirin  81 MG chewable tablet, Chew 1 tablet (81 mg total) by mouth 2 (two) times daily after a meal. (Patient not taking: Reported on 10/30/2023), Disp: 60 tablet, Rfl: 0   methocarbamol  (ROBAXIN ) 500 MG tablet, Take 1 tablet (500 mg total) by mouth every 6 (six) hours as needed for muscle spasms. (Patient not taking: Reported on 10/30/2023), Disp: 40 tablet, Rfl: 1  PAST MEDICAL HISTORY: Past Medical History:  Diagnosis Date   Blind right eye    Hip fx, right, closed, initial encounter (HCC)    Hypertension    not on antihypertensive med    PAST SURGICAL HISTORY: Past Surgical History:  Procedure Laterality Date   TOTAL HIP ARTHROPLASTY Right 01/04/2018   Procedure: TOTAL HIP ARTHROPLASTY ANTERIOR APPROACH;  Surgeon: Vernetta Lonni GRADE, MD;  Location: [facility] OR;  Service: Orthopedics;  Laterality: Right;    FAMILY HISTORY: Family History  Problem Relation Age of Onset   Hypertension Mother     SOCIAL HISTORY: Social History   Socioeconomic History   Marital status: Widowed    Spouse name: Not on file   Number of children: Not on file   Years of education: Not on file   Highest education level: Not on file  Occupational History   Not on file  Tobacco Use   Smoking status: Never   Smokeless tobacco: Never  Substance and Sexual Activity   Alcohol use: No   Drug use: No   Sexual activity: Not on file  Other Topics Concern   Not on file  Social History Narrative   Not on file   Social Drivers of Health   Financial Resource Strain: Not on file  Food Insecurity: Not on file  Transportation Needs: Not on file  Physical Activity:  Not on file  Stress: Not on file  Social Connections: Not on file  Intimate Partner Violence: Not on file       PHYSICAL EXAM  Vitals:   10/30/23 1105  BP: (!) 177/96  Pulse: 83  Weight: 149 lb 8 oz (67.8 kg)  Height: 5' 1 (1.549 m)    Body mass index is 28.25 kg/m.   General: The patient is well-developed and well-nourished and in no acute distress  HEENT:  Head is [place]/AT.  Sclera are anicteric.   \ Neck: No carotid bruits are noted.  The neck is nontender.  Cardiovascular: The heart has a regular rate and rhythm with a normal S1 and S2. There were no murmurs, gallops or rubs.    Skin: Extremities are without rash or  edema.  Musculoskeletal:  Back is nontender  Neurologic Exam  Mental status: The patient is alert and oriented x 3 at the time of the examination. The patient has apparent  normal recent and remote memory, with an apparently normal attention span and concentration ability.   Speech is normal.  Cranial nerves: Extraocular movements are full. Pupils are equal, round, and reactive to light and accomodation.  Visual fields are full.  Facial symmetry is present. There is good facial sensation to soft touch bilaterally.Facial strength is normal.  Trapezius and sternocleidomastoid strength is normal. No dysarthria is noted.  The tongue is midline, and the patient has symmetric elevation of the soft palate. No obvious hearing deficits are noted.  Motor:  Muscle bulk is normal.   Tone is normal. Strength is  5 / 5 in all 4 extremities.   Sensory: Sensory testing is intact to pinprick, soft touch and vibration sensation in all 4 extremities.  Coordination: Cerebellar testing reveals good finger-nose-finger and heel-to-shin bilaterally.  Gait and station: Station is normal.   Gait is mildly wide. Tandem gait is wide. Romberg is negative.   Reflexes: Deep tendon reflexes are symmetric and normal bilaterally.   Plantar responses are flexor.    DIAGNOSTIC DATA  (LABS, IMAGING, TESTING) - I reviewed patient records, labs, notes, testing and imaging myself where available.  Lab Results  Component Value Date   WBC 6.8 10/22/2023   HGB 14.2 10/22/2023   HCT 42.6 10/22/2023   MCV 85.7 10/22/2023   PLT 227 10/22/2023      Component Value Date/Time   NA 139 10/22/2023 1549   K 3.8 10/22/2023 1549   CL 105 10/22/2023 1549   CO2 26 10/22/2023 1549   GLUCOSE 100 (H) 10/22/2023 1549   BUN 13 10/22/2023 1549   CREATININE 0.63 10/22/2023 1549   CALCIUM  9.5 10/22/2023 1549   PROT 7.5 10/22/2023 1549   ALBUMIN 4.0 10/22/2023 1549   AST 18 10/22/2023 1549   ALT 12 10/22/2023 1549   ALKPHOS 65 10/22/2023 1549   BILITOT 0.6 10/22/2023 1549   GFRNONAA >60 10/22/2023 1549   GFRAA >60 01/06/2018 0637   No results found for: CHOL, HDL, LDLCALC, LDLDIRECT, TRIG, CHOLHDL No results found for: YHAJ8R No results found for: VITAMINB12 Lab Results  Component Value Date   TSH 2.090 10/22/2023       ASSESSMENT AND PLAN  Asymptomatic stenosis of intracranial artery - Plan: Lipid panel  Gait disturbance  Balance disorder  Cataract of left eye, unspecified cataract type  In summary, Ms. Tafolla is an 82 year old woman who has had reduced balance over the past 9 or 10 days.  She is actually better now than she was last week.  The etiology is not certain.  MRI rules out acute findings but she does have some chronic microvascular ischemic change that could contribute to reduce gait/balance.  She has some intracranial stenosis though this does not involve the vertebrobasilar system and is probably not playing a role in her current symptoms.  She is neurologically cleared to proceed with cataract surgery.  I will send this note to her ophthalmologist (Dr. Donnice Sickles   [facility] [address], [place]) Etiology of reduced gait is uncertain.  She is better and does not have any long track signs so a spinal cord etiology is  unlikely.  I did discuss with her and her daughter that if symptoms worsen again I would want to check an MRI of the cervical spine to ensure she does not have severe spinal stenosis.  In the interim, she should take extra care to avoid falls. I do not think she had any critical stenosis in the  intracranial circulation though she does have some intracranial stenosis.  Therefore, I would recommend that she begin a baby aspirin .  Additionally, we need to try to get comorbidities well-managed.  I will check lipid panel.  Even if she is within normal range consider low-dose rosuvastatin .  If elevated, we will get her started on a statin and the dose can be optimized later She will return to see me in 1 year to follow-up with the intracranial stenosis but they will call sooner if there are new or worsening neurologic symptoms.    This visit is part of a comprehensive longitudinal care medical relationship regarding the patients primary diagnosis of intracranial stenosis and related concerns.   Neurologically cleared for cataract surgery    Dr.   Charlie LABOR. Vear, MD, [facility] 10/30/2023, 11:22 AM Certified in Neurology, Clinical Neurophysiology, Sleep Medicine and Neuroimaging  [facility] [address] [place], [place] [address] [phone]

## 2023-11-06 ENCOUNTER — Other Ambulatory Visit (INDEPENDENT_AMBULATORY_CARE_PROVIDER_SITE_OTHER): Payer: Self-pay

## 2023-11-06 DIAGNOSIS — Z0289 Encounter for other administrative examinations: Secondary | ICD-10-CM

## 2023-11-06 DIAGNOSIS — I669 Occlusion and stenosis of unspecified cerebral artery: Secondary | ICD-10-CM

## 2023-11-07 ENCOUNTER — Other Ambulatory Visit: Payer: Self-pay | Admitting: Neurology

## 2023-11-07 LAB — LIPID PANEL
Chol/HDL Ratio: 4 {ratio} (ref 0.0–4.4)
Cholesterol, Total: 257 mg/dL — ABNORMAL HIGH (ref 100–199)
HDL: 65 mg/dL (ref >39)
LDL Chol Calc (NIH): 178 mg/dL — ABNORMAL HIGH (ref 0–99)
Triglycerides: 84 mg/dL (ref 0–149)
VLDL Cholesterol Cal: 14 mg/dL (ref 5–40)

## 2023-11-07 MED ORDER — ROSUVASTATIN CALCIUM 10 MG PO TABS: 10.0000 mg | ORAL_TABLET | Freq: Every day | ORAL | 11 refills | Status: DC

## 2023-11-13 ENCOUNTER — Telehealth: Payer: Self-pay | Admitting: Neurology

## 2023-11-13 MED ORDER — ROSUVASTATIN CALCIUM 10 MG PO TABS: 10.0000 mg | ORAL_TABLET | Freq: Every day | ORAL | 11 refills | Status: DC

## 2023-11-13 NOTE — Telephone Encounter (Signed)
 Called [facility] at [phone]. Spoke w/ Caryn Bee. Cx rx rosuvastatin sent there on 11/07/23.   [facility]. I called pt to provide update. She verbalized understanding.

## 2023-11-13 NOTE — Telephone Encounter (Signed)
 Pt called stating that her rosuvastatin (CRESTOR) 10 MG tablet  was called in to the wrong pharmacy. Pt is needing [facility] to [facility] on [address] Please advise.

## 2024-10-28 ENCOUNTER — Encounter: Payer: Self-pay | Admitting: Neurology

## 2024-10-28 ENCOUNTER — Ambulatory Visit: Payer: Medicare PPO | Admitting: Neurology

## 2024-10-28 VITALS — BP 154/90 | HR 95 | Ht 61.0 in | Wt 150.5 lb

## 2024-10-28 DIAGNOSIS — H269 Unspecified cataract: Secondary | ICD-10-CM | POA: Diagnosis not present

## 2024-10-28 DIAGNOSIS — R269 Unspecified abnormalities of gait and mobility: Secondary | ICD-10-CM

## 2024-10-28 DIAGNOSIS — R2689 Other abnormalities of gait and mobility: Secondary | ICD-10-CM

## 2024-10-28 DIAGNOSIS — E785 Hyperlipidemia, unspecified: Secondary | ICD-10-CM | POA: Diagnosis not present

## 2024-10-28 DIAGNOSIS — I669 Occlusion and stenosis of unspecified cerebral artery: Secondary | ICD-10-CM

## 2024-10-28 MED ORDER — ROSUVASTATIN CALCIUM 10 MG PO TABS: 10.0000 mg | ORAL_TABLET | Freq: Every day | ORAL | 4 refills | Status: AC

## 2024-10-28 NOTE — Progress Notes (Signed)
 "  [facility]  PATIENT: Kiara Reynolds DOB: 1941-02-06  REFERRING DOCTOR OR PCP:  Burnard Light, PA-C SOURCE: Patient, notes from emergency room, imaging and lab reports, imaging studies personally reviewed.  _________________________________   HISTORICAL  CHIEF COMPLAINT:  Chief Complaint  Patient presents with   Follow-up    Room 11 - pt is alone for visit today.    Asymptomatic stenosis of intracranial artery     lipid panel checked 11/06/23, started on Rosuvastatin , taking daily, tolerating well. Daily baby Aspirin .     Gait Problem    Denies falls or weakness in the LE.    Balance Disorder   Cataract    L eye, had surgery Jan 2025, vision is much improved. Denies any other visual changes.     HISTORY OF PRESENT ILLNESS:  Kiara Reynolds is a 83 y.o. woman who had a couple weeks of off-balance gait.   UPDATE 10/28/2024 She had an episode of gait unsteadiness that began on 10/20/2023 and she presented to the emergency room.  She was found to have some intracranial stenosis but no acute stroke.   We had seen her due to an acute change in gait 09/2023  Gait was mildly reduced but +/- normal for age prior to the event.   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  Even before the recent episode, she had felt the gait was affected by the 2018 surgery.  Currently, she feels she is back to her pre-December 2024 baseline.  Specifically, she feels gait is mildly unsteady but she has no difficulty ambulating without support.  She has no falls.  She reports some difficulty with tandem gait.  She denies numbness or weakness in her legs.  She is blind OD since birth and had  poor vision on the left due to cataract.  At the last visit, she needed clearance for her cataract surgery   SHe has had her surgery and is seeing better.  At the last visit, we had checked a lipid panel which showed elevated total cholesterol and LDL.  I  placed her on rosuvastatin  10 mg.  She is tolerating that well.  Additionally, a baby aspirin  was started.  She continues on those medications.  She reports that she has not had a repeat lipid panel.  History Of gait issues On 10/20/2023 at night while trying to get sheets on the bed her gait became unsteady.  She notes that that day and the preceding day had been busier than typical days for her.  She continued to be off balanced 10/21/2023.  She denies vertigo, change in strength or sensation.  She stumbled but did not fall.  Her balance was poor though she was able to walk.  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  MRI of the brain showed no acute findings..  She is doing better but gait is still not quite back to baseline.  MRI brain normal brain volume for age.  Mild chronic microvascular ischemic change.     She notes that she did not tolerate aspirin  well in her 36s but is not sure of the dose and  has not taken any recently.    Imaging:  CT angiogram of the head and neck 10/22/2023 showed some narrowing of the right M1 segment.  Additional mild ICA stenosis in the supraclinoid segment bilaterally  MRI of the brain 10/22/2023 showed no acute findings.  There are white matter foci in the pons and hemispheres consistent with mild to moderate chronic microvascular ischemic change.  Brain volume was normal for age  REVIEW OF SYSTEMS: Constitutional: No fevers, chills, sweats, or change in appetite Eyes: No visual changes, double vision, eye pain Ear, nose and throat: No hearing loss, ear pain, nasal congestion, sore throat Cardiovascular: No chest pain, palpitations Respiratory:  No shortness  of breath at rest or with exertion.   No wheezes GastrointestinaI: No nausea, vomiting, diarrhea, abdominal pain, fecal incontinence Genitourinary:  No dysuria, urinary retention or frequency.  No nocturia. Musculoskeletal:  No neck pain, back pain Integumentary: No rash, pruritus, skin lesions Neurological: as above Psychiatric: No depression at this time.  No anxiety Endocrine: No palpitations, diaphoresis, change in appetite, change in weigh or increased thirst Hematologic/Lymphatic:  No anemia, purpura, petechiae. Allergic/Immunologic: No itchy/runny eyes, nasal congestion, recent allergic reactions, rashes  ALLERGIES: Allergies  Allergen Reactions   Penicillins    Aspirin  Other (See Comments)   Atropine Other (See Comments)   Cyclopentolate Other (See Comments)    Eye red, matted and burned    HOME MEDICATIONS:  Current Outpatient Medications:    dorzolamide-timolol (COSOPT) 22.3-6.8 MG/ML ophthalmic solution, Place 1 drop into both eyes 2 (two) times daily., Disp: , Rfl: 0   latanoprost (XALATAN) 0.005 % ophthalmic solution, Place 1 drop into both eyes at bedtime., Disp: , Rfl:    rosuvastatin  (CRESTOR ) 10 MG tablet, Take 1 tablet (10 mg total) by mouth daily., Disp: 30 tablet, Rfl: 11   aspirin  81 MG chewable tablet, Chew 1 tablet (81 mg total) by mouth 2 (two) times daily after a meal. (Patient not taking: Reported on 10/28/2024), Disp: 60 tablet, Rfl: 0   methocarbamol  (ROBAXIN ) 500 MG tablet, Take 1 tablet (500 mg total) by mouth every 6 (six) hours as needed for muscle spasms. (Patient not taking: Reported on 10/28/2024), Disp: 40 tablet, Rfl: 1  PAST MEDICAL HISTORY: Past Medical History:  Diagnosis Date   Blind right eye    Hip fx, right, closed, initial encounter (HCC)    Hypertension    not on antihypertensive med    PAST SURGICAL HISTORY: Past Surgical History:  Procedure Laterality Date   TOTAL HIP ARTHROPLASTY Right 01/04/2018   Procedure: TOTAL HIP  ARTHROPLASTY ANTERIOR APPROACH;  Surgeon: Vernetta Lonni GRADE, MD;  Location: [facility] OR;  Service: Orthopedics;  Laterality: Right;    FAMILY HISTORY: Family History  Problem Relation Age of Onset   Hypertension Mother     SOCIAL HISTORY: Social History   Socioeconomic History   Marital status: Widowed    Spouse name: Not on file   Number of children: Not on file   Years of education: Not on file   Highest education level: Not on file  Occupational History   Not on file  Tobacco Use   Smoking status: Never   Smokeless tobacco: Never  Substance and Sexual Activity   Alcohol use: No   Drug use: No   Sexual activity: Not on file  Other Topics Concern   Not on file  Social History Narrative   Not on file   Social Drivers of Health   Tobacco Use: Low Risk (10/28/2024)  Patient History    Smoking Tobacco Use: Never    Smokeless Tobacco Use: Never    Passive Exposure: Not on file  Financial Resource Strain: Not on file  Food Insecurity: Not on file  Transportation Needs: Not on file  Physical Activity: Not on file  Stress: Not on file  Social Connections: Not on file  Intimate Partner Violence: Not on file  Depression (EYV7-0): Not on file  Alcohol Screen: Not on file  Housing: Not on file  Utilities: Not on file  Health Literacy: Not on file       PHYSICAL EXAM  Vitals:   10/28/24 1121  BP: (!) 154/90  Pulse: 95  SpO2: 98%  Weight: 150 lb 8 oz (68.3 kg)  Height: 5' 1 (1.549 m)    Body mass index is 28.44 kg/m.   General: The patient is well-developed and well-nourished and in no acute distress  HEENT:  Head is [place]/AT.  Sclera are anicteric.   \ Skin: Extremities are without rash or  edema.  Musculoskeletal:  Back is nontender  Neurologic Exam  Mental status: The patient is alert and oriented x 3 at the time of the examination. The patient has apparent normal recent and remote memory, with an apparently normal attention span and concentration  ability.   Speech is normal.  Cranial nerves: Extraocular movements are full.  She is able to read out of the left eye.  The right eye is blind.  There is good facial sensation to soft touch bilaterally.Facial strength is normal.  Trapezius and sternocleidomastoid strength is normal. No dysarthria is noted.  The tongue is midline, and the patient has symmetric elevation of the soft palate. No obvious hearing deficits are noted.  Motor:  Muscle bulk is normal.   Tone is normal. Strength is  5 / 5 in all 4 extremities.   Sensory: Sensory testing is intact to touch and vibration sensation in all 4 extremities.  Coordination: Cerebellar testing reveals good finger-nose-finger and heel-to-shin bilaterally.  Gait and station: Station is normal.   Gait is mildly wide. Tandem gait is wide. Romberg is negative.   Reflexes: Deep tendon reflexes are symmetric and normal bilaterally.       DIAGNOSTIC DATA (LABS, IMAGING, TESTING) - I reviewed patient records, labs, notes, testing and imaging myself where available.  Lab Results  Component Value Date   WBC 6.8 10/22/2023   HGB 14.2 10/22/2023   HCT 42.6 10/22/2023   MCV 85.7 10/22/2023   PLT 227 10/22/2023      Component Value Date/Time   NA 139 10/22/2023 1549   K 3.8 10/22/2023 1549   CL 105 10/22/2023 1549   CO2 26 10/22/2023 1549   GLUCOSE 100 (H) 10/22/2023 1549   BUN 13 10/22/2023 1549   CREATININE 0.63 10/22/2023 1549   CALCIUM  9.5 10/22/2023 1549   PROT 7.5 10/22/2023 1549   ALBUMIN 4.0 10/22/2023 1549   AST 18 10/22/2023 1549   ALT 12 10/22/2023 1549   ALKPHOS 65 10/22/2023 1549   BILITOT 0.6 10/22/2023 1549   GFRNONAA >60 10/22/2023 1549   GFRAA >60 01/06/2018 0637   Lab Results  Component Value Date   CHOL 257 (H) 11/06/2023   HDL 65 11/06/2023   LDLCALC 178 (H) 11/06/2023   TRIG 84 11/06/2023   CHOLHDL 4.0 11/06/2023   No results found for: HGBA1C No results found for: VITAMINB12 Lab Results  Component  Value Date   TSH 2.090 10/22/2023  ASSESSMENT AND PLAN  Gait disturbance  Asymptomatic stenosis of intracranial artery  Balance disorder  Cataract of left eye, unspecified cataract type   Her gait is back to 2024 baseline.  She has not had any falls and denies weakness or numbness in the legs.   She will continue aspirin  81 mg daily and rosuvastatin  10 mg daily.  She is not fasting right now so I will have her come in sometime in the next couple weeks in the morning to have a lipid panel drawn to see if the rosuvastatin  needs to be adjusted.   She will return to see me as needed if she has new or worsening neurologic symptoms.  She is also advised to call us  if she has any trouble getting her rosuvastatin  refilled in the future.    This visit is part of a comprehensive longitudinal care medical relationship regarding the patients primary diagnosis of intracranial stenosis and related concerns.   Neurologically cleared for cataract surgery    Dr.   Charlie LABOR. Vear, MD, [facility] 10/28/2024, 11:42 AM Certified in Neurology, Clinical Neurophysiology, Sleep Medicine and Neuroimaging  [facility] [address], [address] [place], [place] [address] [phone] "
